# Patient Record
Sex: Female | Born: 1995 | Race: White | Hispanic: No | Marital: Single | Smoking: Current every day smoker
Health system: Southern US, Community
[De-identification: ages and names within clinical notes are randomized; demographics above are authoritative.]

## PROBLEM LIST (undated history)

## (undated) DIAGNOSIS — K759 Inflammatory liver disease, unspecified: Secondary | ICD-10-CM

## (undated) DIAGNOSIS — F419 Anxiety disorder, unspecified: Secondary | ICD-10-CM

## (undated) DIAGNOSIS — B009 Herpesviral infection, unspecified: Secondary | ICD-10-CM

## (undated) DIAGNOSIS — J45909 Unspecified asthma, uncomplicated: Secondary | ICD-10-CM

## (undated) DIAGNOSIS — R569 Unspecified convulsions: Secondary | ICD-10-CM

## (undated) DIAGNOSIS — R51 Headache: Secondary | ICD-10-CM

## (undated) DIAGNOSIS — R519 Headache, unspecified: Secondary | ICD-10-CM

## (undated) HISTORY — DX: Herpesviral infection, unspecified: B00.9

---
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
## 2017-08-03 ENCOUNTER — Other Ambulatory Visit: Payer: Self-pay

## 2017-08-03 ENCOUNTER — Encounter (HOSPITAL_COMMUNITY): Payer: Self-pay | Admitting: Behavioral Health

## 2017-08-03 ENCOUNTER — Inpatient Hospital Stay (HOSPITAL_COMMUNITY)
Admission: AD | Admit: 2017-08-03 | Discharge: 2017-08-12 | DRG: 885 | Disposition: A | Discharge status: Home / Self Care | Payer: Medicaid Other | Source: Other Acute Inpatient Hospital | Attending: Psychiatry | Admitting: Psychiatry

## 2017-08-03 DIAGNOSIS — F419 Anxiety disorder, unspecified: Secondary | ICD-10-CM | POA: Diagnosis present

## 2017-08-03 DIAGNOSIS — F15159 Other stimulant abuse with stimulant-induced psychotic disorder, unspecified: Secondary | ICD-10-CM | POA: Diagnosis present

## 2017-08-03 DIAGNOSIS — G4700 Insomnia, unspecified: Secondary | ICD-10-CM | POA: Diagnosis not present

## 2017-08-03 DIAGNOSIS — R450 Nervousness: Secondary | ICD-10-CM | POA: Diagnosis not present

## 2017-08-03 DIAGNOSIS — F39 Unspecified mood [affective] disorder: Secondary | ICD-10-CM | POA: Diagnosis not present

## 2017-08-03 DIAGNOSIS — F17210 Nicotine dependence, cigarettes, uncomplicated: Secondary | ICD-10-CM | POA: Diagnosis not present

## 2017-08-03 DIAGNOSIS — F410 Panic disorder [episodic paroxysmal anxiety] without agoraphobia: Secondary | ICD-10-CM | POA: Diagnosis present

## 2017-08-03 DIAGNOSIS — F15129 Other stimulant abuse with intoxication, unspecified: Secondary | ICD-10-CM | POA: Diagnosis present

## 2017-08-03 DIAGNOSIS — F19959 Other psychoactive substance use, unspecified with psychoactive substance-induced psychotic disorder, unspecified: Secondary | ICD-10-CM | POA: Diagnosis not present

## 2017-08-03 DIAGNOSIS — F0630 Mood disorder due to known physiological condition, unspecified: Secondary | ICD-10-CM | POA: Diagnosis not present

## 2017-08-03 DIAGNOSIS — Z560 Unemployment, unspecified: Secondary | ICD-10-CM | POA: Diagnosis not present

## 2017-08-03 DIAGNOSIS — F1210 Cannabis abuse, uncomplicated: Secondary | ICD-10-CM | POA: Diagnosis not present

## 2017-08-03 DIAGNOSIS — F333 Major depressive disorder, recurrent, severe with psychotic symptoms: Secondary | ICD-10-CM | POA: Diagnosis not present

## 2017-08-03 HISTORY — DX: Unspecified convulsions: R56.9

## 2017-08-03 HISTORY — DX: Headache, unspecified: R51.9

## 2017-08-03 HISTORY — DX: Unspecified asthma, uncomplicated: J45.909

## 2017-08-03 HISTORY — DX: Inflammatory liver disease, unspecified: K75.9

## 2017-08-03 HISTORY — DX: Anxiety disorder, unspecified: F41.9

## 2017-08-03 HISTORY — DX: Headache: R51

## 2017-08-03 MED ORDER — NICOTINE POLACRILEX 2 MG MT GUM: 2.0000 mg | CHEWING_GUM | OROMUCOSAL | Status: DC | PRN

## 2017-08-03 MED ORDER — DIPHENHYDRAMINE HCL 50 MG/ML IJ SOLN
50.0000 mg | Freq: Once | INTRAMUSCULAR | Status: AC
  Administered 2017-08-03: 50 mg via INTRAMUSCULAR
  Filled 2017-08-03: qty 1

## 2017-08-03 MED ORDER — MAGNESIUM HYDROXIDE 400 MG/5ML PO SUSP: 30.0000 mL | Freq: Every day | ORAL | Status: DC | PRN

## 2017-08-03 MED ORDER — ALUM & MAG HYDROXIDE-SIMETH 200-200-20 MG/5ML PO SUSP: 30.0000 mL | ORAL | Status: DC | PRN

## 2017-08-03 MED ORDER — HYDROXYZINE HCL 25 MG PO TABS
25.0000 mg | ORAL_TABLET | Freq: Three times a day (TID) | ORAL | Status: DC | PRN
  Administered 2017-08-05 – 2017-08-06 (×3): 25 mg via ORAL
  Filled 2017-08-03 (×3): qty 1

## 2017-08-03 MED ORDER — TRAZODONE HCL 50 MG PO TABS
50.0000 mg | ORAL_TABLET | Freq: Every evening | ORAL | Status: DC | PRN
  Administered 2017-08-03 – 2017-08-07 (×4): 50 mg via ORAL
  Filled 2017-08-03 (×14): qty 1

## 2017-08-03 MED ORDER — ACETAMINOPHEN 325 MG PO TABS: 650.0000 mg | ORAL_TABLET | Freq: Four times a day (QID) | ORAL | Status: DC | PRN

## 2017-08-03 MED ORDER — DIPHENHYDRAMINE HCL 50 MG/ML IJ SOLN
INTRAMUSCULAR | Status: AC
  Administered 2017-08-03: 50 mg via INTRAMUSCULAR
  Filled 2017-08-03: qty 1

## 2017-08-03 NOTE — BH Assessment (Addendum)
Tele Assessment Note   Patient Name: Emily Barajas MRN: [id number] Referring Physician: Jama Flavors Location of Patient: [facility]300[facility] IP ADULT Location of Provider: Behavioral Health TTS Department  Patient presents as alert and oriented. When asked why she came to the hospital, she states that a lady brought her. Patient was brought to ED by [facility] because she was acting very bizarre, yelling and very disorganized. She admits to using methamphetamine daily and states that she had been very volatile and tripping yesterday. Patient states that she has been using methamphetamine for the last two months approximately two months around one gram daily.  She states that she has been experiencing auditory and tactile hallucinations at times, but states that it is mostly when using methamphetamine.  She states that she is not suicidal or homicidal, but states that she has been treated for depression in the past and was hospitalized four years ago for a suicide attempt  She states that she is currently not on medications.  She states that she is homeless and currently on probation for possession of methamphetamines.  She states that she has never had any SA treatment in the past.  Patient initially said she had never been suicidal, but later told Emily Ruff, NP that she had attempted suicide four years ago and she states that she only heard voices when she was using, but later stated that they occurred most all the time.     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 No pertinent past medical history.  History reviewed. No pertinent surgical history.  Family History: History reviewed. No pertinent family history.  Social History:  reports that she uses drugs. Drugs: Amphetamines and Marijuana. Frequency: 7.00 times per week. She reports that she does not drink alcohol. Her tobacco history is not on file.  Additional  Social History:  Alcohol / Drug Use Pain Medications: denies Prescriptions: denies Over the Counter: denies History of alcohol / drug use?: Yes Longest period of sobriety (when/how long): none reported Negative Consequences of Use: Financial, Legal, Personal relationships, Work / School Substance #1 Name of Substance 1: methamphetamine 1 - Age of First Use: 21 1 - Amount (size/oz): <1 gram 1 - Frequency: daily 1 - Duration: 2 mos 1 - Last Use / Amount: yesterday Substance #2 Name of Substance 2: Marijuana 2 - Age of First Use: 17 2 - Amount (size/oz): unknown 2 - Frequency: daily 2 - Duration: 4 years 2 - Last Use / Amount: unknown  CIWA:   COWS:    PATIENT STRENGTHS: (choose at least two) Ability for insight Communication skills  Allergies: Not on File  Home Medications:  No medications prior to admission.    OB/GYN Status:  No LMP recorded.  General Assessment Data Location of Assessment: [facility] TTS Assessment: Out of system Is this a Tele or Face-to-Face Assessment?: Tele Assessment Is this an Initial Assessment or a Re-assessment for this encounter?: Initial Assessment Marital status: Single Maiden name: Halbleib) Is patient pregnant?: No Pregnancy Status: No Living Arrangements: Other (Comment) Can pt return to current living arrangement?: (homeless) Admission Status: Voluntary Is patient capable of signing voluntary admission?: Yes Referral Source: [facility]) Insurance type: [facility])     Crisis Care Plan Living Arrangements: Other (Comment) Legal Guardian: Other:(self) Name of Psychiatrist: (none) Name of Therapist: none  Education Status Is patient currently in school?: No Current Grade: (NA) Highest grade of school patient has completed: (not assessed) Name of  school: (NA) Contact person: NA  Risk to self with the past 6 months Suicidal Ideation: No Has patient been a risk to self within the past 6 months  prior to admission? : No Suicidal Intent: No Has patient had any suicidal intent within the past 6 months prior to admission? : No Is patient at risk for suicide?: No Suicidal Plan?: No-Not Currently/Within Last 6 Months Has patient had any suicidal plan within the past 6 months prior to admission? : No Access to Means: No What has been your use of drugs/alcohol within the last 12 months?: (2 months for the methamphetamine) Previous Attempts/Gestures: Yes How many times?: 1 Other Self Harm Risks: (homeless, unemployed, minimal support and recent loss issues) Triggers for Past Attempts: Unknown Intentional Self Injurious Behavior: None Family Suicide History: No Recent stressful life event(s): Loss (Comment)(mother) Persecutory voices/beliefs?: No Depression: Yes Depression Symptoms: Isolating, Guilt, Loss of interest in usual pleasures, Feeling worthless/self pity Suicide prevention information given to non-admitted patients: Not applicable  Risk to Others within the past 6 months Homicidal Ideation: No-Not Currently/Within Last 6 Months Does patient have any lifetime risk of violence toward others beyond the six months prior to admission? : No Thoughts of Harm to Others: No Current Homicidal Intent: No Current Homicidal Plan: No Access to Homicidal Means: No Identified Victim: (none) History of harm to others?: No Assessment of Violence: None Noted Violent Behavior Description: (none) Does patient have access to weapons?: No Criminal Charges Pending?: No Does patient have a court date: No Is patient on probation?: Yes  Psychosis Hallucinations: Auditory, Tactile, Visual Delusions: None noted  Mental Status Report Appearance/Hygiene: Disheveled Eye Contact: Fair Motor Activity: Psychomotor retardation Speech: Soft Level of Consciousness: Quiet/awake Mood: Depressed, Apathetic, Despair, Sad Affect: Depressed, Flat Anxiety Level: Severe Thought Processes: Coherent,  Relevant Judgement: Impaired Orientation: Person, Place, Time Obsessive Compulsive Thoughts/Behaviors: Moderate  Cognitive Functioning Concentration: Decreased Memory: Remote Intact, Recent Impaired IQ: Below Average Insight: Poor Impulse Control: Poor Appetite: Poor Sleep: Decreased Total Hours of Sleep: 3  ADLScreening [facility] Services) Patient's cognitive ability adequate to safely complete daily activities?: Yes Patient able to express need for assistance with ADLs?: Yes Independently performs ADLs?: Yes (appropriate for developmental age)  Prior Inpatient Therapy Prior Inpatient Therapy: Yes Prior Therapy Dates: (4 years ago) Prior Therapy Facilty/Provider(s): (denies)  Prior Outpatient Therapy Prior Outpatient Therapy: No Prior Therapy Dates: (no) Prior Therapy Facilty/Provider(s): (none) Reason for Treatment: (NA) Does patient have an ACCT team?: No Does patient have Intensive In-House Services?  : No Does patient have [facility] services? : No Does patient have P4CC services?: No  ADL Screening (condition at time of admission) Patient's cognitive ability adequate to safely complete daily activities?: Yes Is the patient deaf or have difficulty hearing?: No Does the patient have difficulty seeing, even when wearing glasses/contacts?: No Does the patient have difficulty concentrating, remembering, or making decisions?: No Patient able to express need for assistance with ADLs?: Yes Does the patient have difficulty dressing or bathing?: No Independently performs ADLs?: Yes (appropriate for developmental age) Communication: Independent Dressing (OT): Independent Grooming: Independent Feeding: Independent Bathing: Independent Toileting: Independent In/Out Bed: Independent Walks in Home: Independent Does the patient have difficulty walking or climbing stairs?: No Weakness of Legs: None Weakness of Arms/Hands: None             Advance Directives (For  Healthcare) Does Patient Have a Medical Advance Directive?: No Would patient like information on creating a medical advance directive?: No - Patient declined  Additional Information 1:1 In Past 12 Months?: No CIRT Risk: No Elopement Risk: No Does patient have medical clearance?: No     Disposition:  Disposition Initial Assessment Completed for this Encounter: Yes Disposition of Patient: Inpatient treatment program Type of inpatient treatment program: Adult  This service was provided via telemedicine using a 2-way, interactive audio and video technology.  Names of all persons participating in this telemedicine service and their role in this encounter. Name: [place] Laker Role: patient  Name: Josephina Gipanny Zehava Turski, [place], LCAS Role: TTS Counselor  Name:  Role:   Name:  Role:     Daphene CalamityDanny J Brittani Purdum 08/03/2017 6:27 PM

## 2017-08-03 NOTE — Tx Team (Signed)
Initial Treatment Plan 08/03/2017 11:01 PM [name] [id number]    PATIENT STRESSORS: Financial difficulties Health problems Loss of Mother Substance abuse   PATIENT STRENGTHS: Active sense of humor Average or above average intelligence Motivation for treatment/growth   PATIENT IDENTIFIED PROBLEMS: Depression  Suicidal Ideation  Substance abuse (methamphetamine)    "Help me to better understand addiction"             DISCHARGE CRITERIA:  Adequate post-discharge living arrangements Improved stabilization in mood, thinking, and/or behavior Motivation to continue treatment in a less acute level of care Need for constant or close observation no longer present Verbal commitment to aftercare and medication compliance Withdrawal symptoms are absent or subacute and managed without 24-hour nursing intervention  PRELIMINARY DISCHARGE PLAN: Attend 12-step recovery group Outpatient therapy Placement in alternative living arrangements  PATIENT/FAMILY INVOLVEMENT: This treatment plan has been presented to and reviewed with the patient, Emily Barajas.  The patient and family have been given the opportunity to ask questions and make suggestions.  Juliann ParesBowman, Marisa Hufstetler Elizabeth, RN 08/03/2017, 11:01 PM
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
## 2017-08-04 DIAGNOSIS — F333 Major depressive disorder, recurrent, severe with psychotic symptoms: Principal | ICD-10-CM

## 2017-08-04 DIAGNOSIS — F19959 Other psychoactive substance use, unspecified with psychoactive substance-induced psychotic disorder, unspecified: Secondary | ICD-10-CM

## 2017-08-04 DIAGNOSIS — F17210 Nicotine dependence, cigarettes, uncomplicated: Secondary | ICD-10-CM

## 2017-08-04 LAB — COMPREHENSIVE METABOLIC PANEL
ALT: 18 U/L (ref 14–54)
AST: 21 U/L (ref 15–41)
Albumin: 3.2 g/dL — ABNORMAL LOW (ref 3.5–5.0)
Alkaline Phosphatase: 56 U/L (ref 38–126)
Anion gap: 5 (ref 5–15)
BUN: 16 mg/dL (ref 6–20)
CO2: 23 mmol/L (ref 22–32)
Calcium: 8.5 mg/dL — ABNORMAL LOW (ref 8.9–10.3)
Chloride: 110 mmol/L (ref 101–111)
Creatinine, Ser: 0.9 mg/dL (ref 0.44–1.00)
GFR calc Af Amer: >60 mL/min (ref >60)
GFR calc non Af Amer: >60 mL/min (ref >60)
Glucose, Bld: 88 mg/dL (ref 65–99)
Potassium: 4.2 mmol/L (ref 3.5–5.1)
Sodium: 138 mmol/L (ref 135–145)
Total Bilirubin: 0.8 mg/dL (ref 0.3–1.2)
Total Protein: 6.1 g/dL — ABNORMAL LOW (ref 6.5–8.1)

## 2017-08-04 LAB — PREGNANCY, URINE: Preg Test, Ur: NEGATIVE

## 2017-08-04 LAB — RAPID URINE DRUG SCREEN, HOSP PERFORMED
Amphetamines: POSITIVE — AB
Barbiturates: NOT DETECTED
Benzodiazepines: NOT DETECTED
Cocaine: NOT DETECTED
Opiates: NOT DETECTED
Tetrahydrocannabinol: POSITIVE — AB

## 2017-08-04 MED ORDER — RISPERIDONE 0.5 MG PO TABS
0.5000 mg | ORAL_TABLET | Freq: Two times a day (BID) | ORAL | Status: DC
  Administered 2017-08-04 – 2017-08-05 (×3): 0.5 mg via ORAL
  Filled 2017-08-04 (×6): qty 1

## 2017-08-04 NOTE — Plan of Care (Deleted)
Patient provided uninterrupted sleep
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
## 2017-08-04 NOTE — [facility] Group Notes (Signed)
[facility] Group Notes:  (Nursing)  Date:  08/04/2017  Time:  9:00 am Type of Therapy:  Nurse Education  Participation Level:  Did Not Attend  Participation Quality:  did not attend  Affect:  did not attend  Cognitive:  did not attend  Insight:  None  Engagement in Group:  did not attend  Modes of Intervention:  did not attend  Summary of Progress/Problems: Patient remained in bed during group  Shela NevinValerie S Toryn Dewalt 08/04/2017, 1:48 PM

## 2017-08-04 NOTE — Progress Notes (Addendum)
D. Pt in bed sleeping upon initial approach -resp even and unlabored. Pt easily aroused from sleep and was brought a pitcher of water and encouraged to drink fluids.  Pt denies pain and reports that she just wants to sleep because she 'hasn't slept in awhile'. With encouragement, pt walked to cafeteria with peers for lunch. Pt currently denies SI/HI . When pt was asked about A/V hallucinations, pt responded, "sometimes my ear is drained". When pt asked to elaborate she states, "you know how your vocabulary is empty?"  Pt completed self inventory, rating her depression a 5/10 and hopelessness a 6/10. On self inventory, pt writes as her goal today, "to live", and  "sleep then understand", when asked how she will meet her goal. When asked if there is anything else she would like to tell staff or questions she may have, she responded by writing, "foods, colors,". A. Labs and vitals monitored. . Pt supported emotionally and encouraged to express concerns and ask questions.   R. Pt remains safe with 15 minute checks. Will continue POC.

## 2017-08-04 NOTE — [facility] Group Notes (Signed)
The focus of this group is to educate the patient on the purpose and policies of crisis stabilization and provide a format to answer questions about their admission.  The group details unit policies and expectations of patients while admitted.  Patient did not attend 0900 nurse education orientation group this morning.  Patient stayed in bed.   

## 2017-08-04 NOTE — H&P (Signed)
Psychiatric Admission Assessment Adult  Patient Identification: Emily Barajas MRN:  [id number] Date of Evaluation:  08/04/2017 Chief Complaint:  Bizarre behavior and hallucinations Principal Diagnosis: Substance Induced Psychotic Disorder Diagnosis:   Patient Active Problem List   Diagnosis Date Noted  . Severe recurrent major depression with psychotic features [facility](HCC) [F33.3] 08/03/2017   History of Present Illness:  21 y.o Caucasian female, single, no kids, unemployed, lives with her family. Background history of SUD. Presented to the unit via MCT. Reported to have been acting in a bizarre manner. Reported having tactile and auditory hallucination. Intoxicated with methamphetamine.    At interview, patient reports history of methamphetamine use for the past three years. Says she was just having a bad trip. She was hearing a lot of noises. She could not make out what is being said. She was not getting any specific instructions. She was having a lot of irritability as it felt as if ants were crawling all over her body. Says she was seeing "the city". Patient denies any suicidal thoughts. She did not have any thoughts to harm other. Says she was given some shot at the hospital. Says she feels marginally better. The hallucinations are less intense. No persecutory delusion. Not expressing any other form of delusion. Patient denies use of alcohol or any other substance. She denies any access to weapons. Denies any stressors.   Total Time spent with patient: 1 hour  Past Psychiatric History: Long history of SUD. Off and on transient psychotic episodes from the substances. Says she had been treated with Risperidone in the past. No past follow up. No past inpatient care. No past chemical dependency treatment. Overdosed four years ago. Denies any other suicidal behavior. No past history of violent behavior.   Is the patient at risk to self? No.  Has the patient been a risk to self in the past 6  months? No.  Has the patient been a risk to self within the distant past? Yes.    Is the patient a risk to others? No.  Has the patient been a risk to others in the past 6 months? No.  Has the patient been a risk to others within the distant past? No.   Prior Inpatient Therapy: Prior Inpatient Therapy: Yes Prior Therapy Dates: (4 years ago) Prior Therapy Facilty/Provider(s): (denies) Prior Outpatient Therapy: Prior Outpatient Therapy: No Prior Therapy Dates: (no) Prior Therapy Facilty/Provider(s): (none) Reason for Treatment: (NA) Does patient have an ACCT team?: No Does patient have Intensive In-House Services?  : No Does patient have [facility] services? : No Does patient have P4CC services?: No  Alcohol Screening: 1. How often do you have a drink containing alcohol?: Never 2. How many drinks containing alcohol do you have on a typical day when you are drinking?: 1 or 2 3. How often do you have six or more drinks on one occasion?: Never AUDIT-C Score: 0 Intervention/Follow-up: AUDIT Score <7 follow-up not indicated Substance Abuse History in the last 12 months:  Yes.   Consequences of Substance Abuse: As above  Previous Psychotropic Medications: Yes  Psychological Evaluations: No  Past Medical History:  Past Medical History:  Diagnosis Date  . Anxiety   . Asthma   . Headache   . Hepatitis   . Seizures (HCC)    Pt does not remember when last one was, states "it's been awhile"   History reviewed. No pertinent surgical history. Family History: History reviewed. No pertinent family history. Family Psychiatric  History: Denies any family history  of mental illness or suicide. Strong family history of addiction.  Tobacco Screening: Have you used any form of tobacco in the last 30 days? (Cigarettes, Smokeless Tobacco, Cigars, and/or Pipes): Yes Tobacco use, Select all that apply: 5 or more cigarettes per day Are you interested in Tobacco Cessation Medications?: Yes, will notify MD  for an order Counseled patient on smoking cessation including recognizing danger situations, developing coping skills and basic information about quitting provided: Refused/Declined practical counseling Social History:  Social History   Substance and Sexual Activity  Alcohol Use No  . Frequency: Never     Social History   Substance and Sexual Activity  Drug Use Yes  . Frequency: 7.0 times per week  . Types: Amphetamines, Marijuana    Additional Social History: Marital status: Single    Pain Medications: denies Prescriptions: denies Over the Counter: denies History of alcohol / drug use?: Yes Longest period of sobriety (when/how long): none reported Negative Consequences of Use: Financial, Armed forces operational officerLegal, Personal relationships, Work / School Name of Substance 1: methamphetamine 1 - Age of First Use: 21 1 - Amount (size/oz): <1 gram 1 - Frequency: daily 1 - Duration: 2 mos 1 - Last Use / Amount: yesterday Name of Substance 2: Marijuana 2 - Age of First Use: 17 2 - Amount (size/oz): unknown 2 - Frequency: daily 2 - Duration: 4 years 2 - Last Use / Amount: unknown                Allergies:   Allergies  Allergen Reactions  . Amoxicillin Anaphylaxis   Lab Results: No results found for this or any previous visit (from the past 48 hour(s)).  Blood Alcohol level:  No results found for: [facility]  Metabolic Disorder Labs:  No results found for: HGBA1C, MPG No results found for: PROLACTIN No results found for: CHOL, TRIG, HDL, CHOLHDL, VLDL, LDLCALC  Current Medications: Current Facility-Administered Medications  Medication Dose Route Frequency Provider Last Rate Last Dose  . acetaminophen (TYLENOL) tablet 650 mg  650 mg Oral Q6H PRN Jackelyn PolingBerry, Jason A, NP      . alum & mag hydroxide-simeth (MAALOX/MYLANTA) 200-200-20 MG/5ML suspension 30 mL  30 mL Oral Q4H PRN Nira ConnBerry, Jason A, NP      . hydrOXYzine (ATARAX/VISTARIL) tablet 25 mg  25 mg Oral TID PRN Nira ConnBerry, Jason A, NP      .  magnesium hydroxide (MILK OF MAGNESIA) suspension 30 mL  30 mL Oral Daily PRN Nira ConnBerry, Jason A, NP      . nicotine polacrilex (NICORETTE) gum 2 mg  2 mg Oral PRN Cobos, Rockey SituFernando A, MD      . traZODone (DESYREL) tablet 50 mg  50 mg Oral QHS,MR X 1 Nira ConnBerry, Jason A, NP   50 mg at 08/03/17 2221   PTA Medications: No medications prior to admission.    Musculoskeletal: Strength & Muscle Tone: within normal limits Gait & Station: normal Patient leans: N/A  Psychiatric Specialty Exam: Physical Exam  Constitutional: She appears distressed.  HENT:  Head: Normocephalic and atraumatic.  Respiratory: Effort normal.  Psychiatric:  As above    ROS  Blood pressure 110/70, pulse 92, temperature 98.4 F (36.9 C), resp. rate 16, height 5\' 1"  (1.549 m), weight 54 kg (119 lb).Body mass index is 22.48 kg/m.  General Appearance: In hospital clothing, withdrawn, underlying irritability.   Eye Contact:  Minimal  Speech:  Garbled  Volume:  Decreased  Mood:  Dysphoric and Irritable  Affect:  Blunted and mood congruent  Thought Process:  Linear  Orientation:  Full (Time, Place, and Person)  Thought Content:  Auditory and tactile hallucinations. No thoughts of violence.   Suicidal Thoughts:  No  Homicidal Thoughts:  No  Memory:  Unable to assess at this time.   Judgement:  Poor  Insight:  Shallow  Psychomotor Activity:  Decreased  Concentration:  Poor   Recall:  Unable to assess at this time.   Fund of Knowledge:  Unable to assess at this time.   Language:  Fair  Akathisia:  Negative  Handed:    AIMS (if indicated):     Assets:  Physical Health Resilience  ADL's:  Impaired  Cognition:  Impaired,  Mild  Sleep:  Number of Hours: 6.75    Treatment Plan Summary: Patient is intoxicated with methamphetamine. She is still internally distracted. I discussed use of Risperidone to target psychosis. She consented to treatment after we reviewed the risks and benefits.   Psychiatric: Substance Induced  Psychotic disorder  Medical:  Psychosocial:   PLAN: 1. Risperidone 0.5 mg BID 2. Encourage unit groups and activities 3. Monitor mood, behavior and interaction with peers 4. Motivational enhancement  5. SW would gather collateral and facilitate aftercare.    Observation Level/Precautions:  15 minute checks  Laboratory:  CBC Chemistry Profile HCG UDS  Psychotherapy:    Medications:    Consultations:    Discharge Concerns:    Estimated LOS:  Other:     Physician Treatment Plan for Primary Diagnosis: <principal problem not specified> Long Term Goal(s): Improvement in symptoms so as ready for discharge  Short Term Goals: Ability to identify changes in lifestyle to reduce recurrence of condition will improve, Ability to verbalize feelings will improve, Ability to disclose and discuss suicidal ideas, Ability to demonstrate self-control will improve, Ability to identify and develop effective coping behaviors will improve, Ability to maintain clinical measurements within normal limits will improve, Compliance with prescribed medications will improve and Ability to identify triggers associated with substance abuse/mental health issues will improve  Physician Treatment Plan for Secondary Diagnosis: Active Problems:   Severe recurrent major depression with psychotic features (HCC)  Long Term Goal(s): Improvement in symptoms so as ready for discharge  Short Term Goals: Ability to identify changes in lifestyle to reduce recurrence of condition will improve, Ability to verbalize feelings will improve, Ability to disclose and discuss suicidal ideas, Ability to demonstrate self-control will improve, Ability to identify and develop effective coping behaviors will improve, Ability to maintain clinical measurements within normal limits will improve, Compliance with prescribed medications will improve and Ability to identify triggers associated with substance abuse/mental health issues will  improve  I certify that inpatient services furnished can reasonably be expected to improve the patient's condition.    Georgiann Cocker, MD 12/23/201812:00 PM

## 2017-08-04 NOTE — [facility] Group Notes (Signed)
[facility] LCSW Group Therapy Note  Date/Time:  08/04/2017 1:30-2:30pm  Type of Therapy and Topic:  Group Therapy:  Healthy and Unhealthy Supports  Participation Level:  Did Not Attend   Description of Group:  Patients in this group were introduced to the idea of adding a variety of healthy supports to address the various needs in their lives.  Because of one patient's resistance and insistence that drugs help rather than harm, the Stages of Change were introduced and ideas proposed about appropriate supports for each stage.  Therapeutic Goals:   1)  discuss importance of adding supports to stay well once out of the hospital  2)  compare healthy versus unhealthy supports and identify some examples of each  3)  Describe Stages of Change  3)  generate ideas and descriptions of healthy supports that can be added for each Stage  5)  encourage active participation in and adherence to discharge plan    Summary of Patient Progress:  N/A   Therapeutic Modalities:   Motivational Interviewing Brief Solution-Focused Therapy  Ambrose MantleMareida Grossman-Orr, LCSW
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
## 2017-08-05 DIAGNOSIS — F0630 Mood disorder due to known physiological condition, unspecified: Secondary | ICD-10-CM

## 2017-08-05 DIAGNOSIS — F419 Anxiety disorder, unspecified: Secondary | ICD-10-CM

## 2017-08-05 DIAGNOSIS — R450 Nervousness: Secondary | ICD-10-CM

## 2017-08-05 LAB — CBC WITH DIFFERENTIAL/PLATELET
Basophils Absolute: 0 10*3/uL (ref 0.0–0.1)
Basophils Relative: 0 %
Eosinophils Absolute: 0.1 10*3/uL (ref 0.0–0.7)
Eosinophils Relative: 2 %
HCT: 41.4 % (ref 36.0–46.0)
Hemoglobin: 13.8 g/dL (ref 12.0–15.0)
Lymphocytes Relative: 43 %
Lymphs Abs: 3.2 10*3/uL (ref 0.7–4.0)
MCH: 29.1 pg (ref 26.0–34.0)
MCHC: 33.3 g/dL (ref 30.0–36.0)
MCV: 87.2 fL (ref 78.0–100.0)
Monocytes Absolute: 0.5 10*3/uL (ref 0.1–1.0)
Monocytes Relative: 6 %
Neutro Abs: 3.7 10*3/uL (ref 1.7–7.7)
Neutrophils Relative %: 49 %
Platelets: 287 10*3/uL (ref 150–400)
RBC: 4.75 MIL/uL (ref 3.87–5.11)
RDW: 12.8 % (ref 11.5–15.5)
WBC: 7.5 10*3/uL (ref 4.0–10.5)

## 2017-08-05 LAB — LIPID PANEL
Cholesterol: 153 mg/dL (ref 0–200)
HDL: 49 mg/dL (ref >40)
LDL Cholesterol: 93 mg/dL (ref 0–99)
Total CHOL/HDL Ratio: 3.1 RATIO
Triglycerides: 53 mg/dL (ref <150)
VLDL: 11 mg/dL (ref 0–40)

## 2017-08-05 MED ORDER — CITALOPRAM HYDROBROMIDE 10 MG PO TABS
10.0000 mg | ORAL_TABLET | Freq: Every day | ORAL | Status: DC
  Administered 2017-08-05 – 2017-08-10 (×6): 10 mg via ORAL
  Filled 2017-08-05 (×8): qty 1

## 2017-08-05 NOTE — Progress Notes (Signed)
[facility] MD Progress Note  08/05/2017 11:19 AM Emily Barajas  MRN:  [id number] Subjective:  Alert, oriented, subuded, depressed, didn't sleep well HPI: As per initial admission note :21 y.o Caucasian female, single, no kids, unemployed, lives with her family. Background history of SUD. Presented to the unit via MCT. Reported to have been acting in a bizarre manner. Reported having tactile and auditory hallucination. Intoxicated with methamphetamine.    At interview, patient reports history of methamphetamine use for the past three years. Says she was just having a bad trip. She was hearing a lot of noises. She could not make out what is being said. She was not getting any specific instructions. She was having a lot of irritability"  On evaluation today somewhat calmer, remains subdued. Less irritable. Denies voices. Feels she is fatigue after being off of meth now Feeling anxious, dysphoric   Principal Problem: Substance-induced psychotic disorder ([place]) Diagnosis:   Patient Active Problem List   Diagnosis Date Noted  . Substance-induced psychotic disorder [facility]) [F19.959] 08/03/2017   Total Time spent with patient: 20 minutes  Past Psychiatric History: substance use  Past Medical History:  Past Medical History:  Diagnosis Date  . Anxiety   . Asthma   . Headache   . Hepatitis   . Seizures ([place])    Pt does not remember when last one was, states "it's been awhile"   History reviewed. No pertinent surgical history. Family History: History reviewed. No pertinent family history. Family Psychiatric  History: see chart Social History:  Social History   Substance and Sexual Activity  Alcohol Use No  . Frequency: Never     Social History   Substance and Sexual Activity  Drug Use Yes  . Frequency: 7.0 times per week  . Types: Amphetamines, Marijuana    Social History   Socioeconomic History  . Marital status: Single    Spouse name: None  . Number of children: None  . Years  of education: None  . Highest education level: None  Social Needs  . Financial resource strain: None  . Food insecurity - worry: None  . Food insecurity - inability: None  . Transportation needs - medical: None  . Transportation needs - non-medical: None  Occupational History  . None  Tobacco Use  . Smoking status: Current Every Day Smoker    Packs/day: 1.00    Types: Cigarettes  . Smokeless tobacco: Never Used  Substance and Sexual Activity  . Alcohol use: No    Frequency: Never  . Drug use: Yes    Frequency: 7.0 times per week    Types: Amphetamines, Marijuana  . Sexual activity: Not Currently  Other Topics Concern  . None  Social History Narrative  . None   Additional Social History:    Pain Medications: denies Prescriptions: denies Over the Counter: denies History of alcohol / drug use?: Yes Longest period of sobriety (when/how long): none reported Negative Consequences of Use: Financial, Legal, Personal relationships, Work / School Name of Substance 1: methamphetamine 1 - Age of First Use: 21 1 - Amount (size/oz): <1 gram 1 - Frequency: daily 1 - Duration: 2 mos 1 - Last Use / Amount: yesterday Name of Substance 2: Marijuana 2 - Age of First Use: 17 2 - Amount (size/oz): unknown 2 - Frequency: daily 2 - Duration: 4 years 2 - Last Use / Amount: unknown                Sleep: Fair  Appetite:  Fair  Current Medications: Current Facility-Administered Medications  Medication Dose Route Frequency Provider Last Rate Last Dose  . acetaminophen (TYLENOL) tablet 650 mg  650 mg Oral Q6H PRN Rozetta Nunnery, NP      . alum & mag hydroxide-simeth (MAALOX/MYLANTA) 200-200-20 MG/5ML suspension 30 mL  30 mL Oral Q4H PRN Rozetta Nunnery, NP      . citalopram (CELEXA) tablet 10 mg  10 mg Oral Daily Merian Capron, MD      . hydrOXYzine (ATARAX/VISTARIL) tablet 25 mg  25 mg Oral TID PRN Lindon Romp A, NP      . magnesium hydroxide (MILK OF MAGNESIA) suspension 30 mL   30 mL Oral Daily PRN Lindon Romp A, NP      . nicotine polacrilex (NICORETTE) gum 2 mg  2 mg Oral PRN Cobos, Myer Peer, MD      . risperiDONE (RISPERDAL) tablet 0.5 mg  0.5 mg Oral BID Izediuno, Laruth Bouchard, MD   0.5 mg at 08/05/17 0754  . traZODone (DESYREL) tablet 50 mg  50 mg Oral QHS,MR X 1 Lindon Romp A, NP   50 mg at 08/03/17 2221    Lab Results:  Results for orders placed or performed during the hospital encounter of 08/03/17 (from the past 48 hour(s))  Comprehensive metabolic panel     Status: Abnormal   Collection Time: 08/04/17  6:45 PM  Result Value Ref Range   Sodium 138 135 - 145 mmol/L   Potassium 4.2 3.5 - 5.1 mmol/L   Chloride 110 101 - 111 mmol/L   CO2 23 22 - 32 mmol/L   Glucose, Bld 88 65 - 99 mg/dL   BUN 16 6 - 20 mg/dL   Creatinine, Ser 0.90 0.44 - 1.00 mg/dL   Calcium 8.5 (L) 8.9 - 10.3 mg/dL   Total Protein 6.1 (L) 6.5 - 8.1 g/dL   Albumin 3.2 (L) 3.5 - 5.0 g/dL   AST 21 15 - 41 U/L   ALT 18 14 - 54 U/L   Alkaline Phosphatase 56 38 - 126 U/L   Total Bilirubin 0.8 0.3 - 1.2 mg/dL   GFR calc non Af Amer >60 >60 mL/min   GFR calc Af Amer >60 >60 mL/min    Comment: (NOTE) The eGFR has been calculated using the CKD EPI equation. This calculation has not been validated in all clinical situations. eGFR's persistently <60 mL/min signify possible Chronic Kidney Disease.    Anion gap 5 5 - 15    Comment: Performed at [facility], [place] [address] [place], [place] [address]  Rapid urine drug screen (hospital performed)     Status: Abnormal   Collection Time: 08/04/17  8:44 PM  Result Value Ref Range   Opiates NONE DETECTED NONE DETECTED   Cocaine NONE DETECTED NONE DETECTED   Benzodiazepines NONE DETECTED NONE DETECTED   Amphetamines POSITIVE (A) NONE DETECTED   Tetrahydrocannabinol POSITIVE (A) NONE DETECTED   Barbiturates NONE DETECTED NONE DETECTED    Comment: (NOTE) DRUG SCREEN FOR MEDICAL PURPOSES ONLY.  IF CONFIRMATION IS NEEDED FOR  ANY PURPOSE, NOTIFY LAB WITHIN 5 DAYS. LOWEST DETECTABLE LIMITS FOR URINE DRUG SCREEN Drug Class                     Cutoff (ng/mL) Amphetamine and metabolites    1000 Barbiturate and metabolites    200 Benzodiazepine                 914 Tricyclics and metabolites  300 Opiates and metabolites        300 Cocaine and metabolites        300 THC                            50 Performed at [facility] [address], [place], [place] [address]   Pregnancy, urine     Status: None   Collection Time: 08/04/17  8:44 PM  Result Value Ref Range   Preg Test, Ur NEGATIVE NEGATIVE    Comment:        THE SENSITIVITY OF THIS METHODOLOGY IS >20 mIU/mL. Performed at [facility], [place] [address], [place], [place] [address]   Lipid panel     Status: None   Collection Time: 08/05/17  6:10 AM  Result Value Ref Range   Cholesterol 153 0 - 200 mg/dL   Triglycerides 53 <150 mg/dL   HDL 49 >40 mg/dL   Total CHOL/HDL Ratio 3.1 RATIO   VLDL 11 0 - 40 mg/dL   LDL Cholesterol 93 0 - 99 mg/dL    Comment:        Total Cholesterol/HDL:CHD Risk Coronary Heart Disease Risk Table                     Men   Women  1/2 Average Risk   3.4   3.3  Average Risk       5.0   4.4  2 X Average Risk   9.6   7.1  3 X Average Risk  23.4   11.0        Use the calculated Patient Ratio above and the CHD Risk Table to determine the patient's CHD Risk.        ATP III CLASSIFICATION (LDL):  <100     mg/dL   Optimal  100-129  mg/dL   Near or Above                    Optimal  130-159  mg/dL   Borderline  160-189  mg/dL   High  >190     mg/dL   Very High Performed at [facility] [address], [place], [place] [address]   CBC with Differential/Platelet     Status: None   Collection Time: 08/05/17  6:10 AM  Result Value Ref Range   WBC 7.5 4.0 - 10.5 K/uL   RBC 4.75 3.87 - 5.11 MIL/uL   Hemoglobin 13.8 12.0 - 15.0 g/dL   HCT 41.4 36.0 - 46.0 %   MCV  87.2 78.0 - 100.0 fL   MCH 29.1 26.0 - 34.0 pg   MCHC 33.3 30.0 - 36.0 g/dL   RDW 12.8 11.5 - 15.5 %   Platelets 287 150 - 400 K/uL   Neutrophils Relative % 49 %   Neutro Abs 3.7 1.7 - 7.7 K/uL   Lymphocytes Relative 43 %   Lymphs Abs 3.2 0.7 - 4.0 K/uL   Monocytes Relative 6 %   Monocytes Absolute 0.5 0.1 - 1.0 K/uL   Eosinophils Relative 2 %   Eosinophils Absolute 0.1 0.0 - 0.7 K/uL   Basophils Relative 0 %   Basophils Absolute 0.0 0.0 - 0.1 K/uL    Comment: Performed at [facility], [place] [address], [place], [place] [address]    Blood Alcohol level:  No results found for: [facility]  Metabolic Disorder Labs: No  results found for: HGBA1C, MPG No results found for: PROLACTIN Lab Results  Component Value Date   CHOL 153 08/05/2017   TRIG 53 08/05/2017   HDL 49 08/05/2017   CHOLHDL 3.1 08/05/2017   VLDL 11 08/05/2017   LDLCALC 93 08/05/2017    Physical Findings: AIMS: Facial and Oral Movements Muscles of Facial Expression: Mild Lips and Perioral Area: Mild Jaw: Mild Tongue: Mild,Extremity Movements Upper (arms, wrists, hands, fingers): None, normal Lower (legs, knees, ankles, toes): None, normal, Trunk Movements Neck, shoulders, hips: None, normal, Overall Severity Severity of abnormal movements (highest score from questions above): Mild Incapacitation due to abnormal movements: None, normal Patient's awareness of abnormal movements (rate only patient's report): Aware, mild distress, Dental Status Current problems with teeth and/or dentures?: No Does patient usually wear dentures?: No  CIWA:  CIWA-Ar Total: 4 COWS:     Musculoskeletal: Strength & Muscle Tone: within normal limits Gait & Station: normal Patient leans: no lean  Psychiatric Specialty Exam: Physical Exam  Constitutional: She appears well-developed.    Review of Systems  Cardiovascular: Negative for chest pain.  Skin: Negative for rash.  Psychiatric/Behavioral: Positive for  depression and substance abuse. The patient is nervous/anxious.     Blood pressure 113/87, pulse 95, temperature (!) 97.4 F (36.3 C), temperature source Oral, resp. rate 16, height 5' 1" (1.549 m), weight 54 kg (119 lb).Body mass index is 22.48 kg/m.  General Appearance: Casual  Eye Contact:  Fair  Speech:  Normal Rate  Volume:  Decreased  Mood:  Dysphoric  Affect:  Congruent  Thought Process:  Goal Directed  Orientation:  Full (Time, Place, and Person)  Thought Content:  Paranoid Ideation  Suicidal Thoughts:  No  Homicidal Thoughts:  No  Memory:  Immediate;   Fair Recent;   Fair  Judgement:  Poor  Insight:  Shallow  Psychomotor Activity:  Normal  Concentration:  Concentration: Fair and Attention Span: Fair  Recall:  [employer] of Knowledge:  Fair  Language:  Fair  Akathisia:  Negative  Handed:  Right  AIMS (if indicated):     Assets:  Desire for Improvement Social Support  ADL's:  Intact  Cognition:  WNL  Sleep:  Number of Hours: 6.75     Treatment Plan Summary: Daily contact with patient to assess and evaluate symptoms and progress in treatment, Medication management and Plan as follows  1. Substance induced psychotic disorder: improved. On risperdal. Will continue 2. Mood disorder ; dysphoric. Will start celexa 1m qd Encourage support groups and unit activities.   NMerian Capron MD 08/05/2017, 11:19 AM

## 2017-08-05 NOTE — Progress Notes (Signed)
DAR NOTE: Patient presents with anxious affect and depressed mood.  Denies pain, auditory and visual hallucinations.  Described energy level as low and concentration as good.  Rates depression at 5, hopelessness at 6, and anxiety at 6.  Maintained on routine safety checks.  Medications given as prescribed.  Support and encouragement offered as needed.  Attended group and participated.  States goal for today is "my withdrawals."  Patient observed socializing with peers in the dayroom.  Lab unable to complete blood draw due to scar on arms.  Patient is safe on the unit.

## 2017-08-05 NOTE — Tx Team (Signed)
Interdisciplinary Treatment and Diagnostic Plan Update  08/05/2017 Time of Session: 8:01 AM  Emily Barajas MRN: [id number]  Principal Diagnosis: Substance-induced psychotic disorder [facility])  Secondary Diagnoses: Principal Problem:   Substance-induced psychotic disorder ([place])   Current Medications:  Current Facility-Administered Medications  Medication Dose Route Frequency Provider Last Rate Last Dose  . acetaminophen (TYLENOL) tablet 650 mg  650 mg Oral Q6H PRN Rozetta Nunnery, NP      . alum & mag hydroxide-simeth (MAALOX/MYLANTA) 200-200-20 MG/5ML suspension 30 mL  30 mL Oral Q4H PRN Lindon Romp A, NP      . hydrOXYzine (ATARAX/VISTARIL) tablet 25 mg  25 mg Oral TID PRN Lindon Romp A, NP      . magnesium hydroxide (MILK OF MAGNESIA) suspension 30 mL  30 mL Oral Daily PRN Lindon Romp A, NP      . nicotine polacrilex (NICORETTE) gum 2 mg  2 mg Oral PRN Cobos, Myer Peer, MD      . risperiDONE (RISPERDAL) tablet 0.5 mg  0.5 mg Oral BID Izediuno, Laruth Bouchard, MD   0.5 mg at 08/05/17 0754  . traZODone (DESYREL) tablet 50 mg  50 mg Oral QHS,MR X 1 Lindon Romp A, NP   50 mg at 08/03/17 2221    PTA Medications: No medications prior to admission.    Patient Stressors: Financial difficulties Health problems Loss of Mother Substance abuse  Patient Strengths: Ability for insight Communication skills  Treatment Modalities: Medication Management, Group therapy, Case management,  1 to 1 session with clinician, Psychoeducation, Recreational therapy.   Physician Treatment Plan for Primary Diagnosis: Substance-induced psychotic disorder ([place]) Long Term Goal(s): Improvement in symptoms so as ready for discharge  Short Term Goals: Ability to identify changes in lifestyle to reduce recurrence of condition will improve Ability to verbalize feelings will improve Ability to disclose and discuss suicidal ideas Ability to demonstrate self-control will improve Ability to identify and develop  effective coping behaviors will improve Ability to maintain clinical measurements within normal limits will improve Compliance with prescribed medications will improve Ability to identify triggers associated with substance abuse/mental health issues will improve Ability to identify changes in lifestyle to reduce recurrence of condition will improve Ability to verbalize feelings will improve Ability to disclose and discuss suicidal ideas Ability to demonstrate self-control will improve Ability to identify and develop effective coping behaviors will improve Ability to maintain clinical measurements within normal limits will improve Compliance with prescribed medications will improve Ability to identify triggers associated with substance abuse/mental health issues will improve  Medication Management: Evaluate patient's response, side effects, and tolerance of medication regimen.  Therapeutic Interventions: 1 to 1 sessions, Unit Group sessions and Medication administration.  Evaluation of Outcomes: Progressing  Physician Treatment Plan for Secondary Diagnosis: Principal Problem:   Substance-induced psychotic disorder ([place])   Long Term Goal(s): Improvement in symptoms so as ready for discharge  Short Term Goals: Ability to identify changes in lifestyle to reduce recurrence of condition will improve Ability to verbalize feelings will improve Ability to disclose and discuss suicidal ideas Ability to demonstrate self-control will improve Ability to identify and develop effective coping behaviors will improve Ability to maintain clinical measurements within normal limits will improve Compliance with prescribed medications will improve Ability to identify triggers associated with substance abuse/mental health issues will improve Ability to identify changes in lifestyle to reduce recurrence of condition will improve Ability to verbalize feelings will improve Ability to disclose and discuss  suicidal ideas Ability to demonstrate self-control will improve  Ability to identify and develop effective coping behaviors will improve Ability to maintain clinical measurements within normal limits will improve Compliance with prescribed medications will improve Ability to identify triggers associated with substance abuse/mental health issues will improve  Medication Management: Evaluate patient's response, side effects, and tolerance of medication regimen.  Therapeutic Interventions: 1 to 1 sessions, Unit Group sessions and Medication administration.  Evaluation of Outcomes: Progressing   RN Treatment Plan for Primary Diagnosis: Substance-induced psychotic disorder ([place]) Long Term Goal(s): Knowledge of disease and therapeutic regimen to maintain health will improve  Short Term Goals: Ability to identify and develop effective coping behaviors will improve and Compliance with prescribed medications will improve  Medication Management: RN will administer medications as ordered by provider, will assess and evaluate patient's response and provide education to patient for prescribed medication. RN will report any adverse and/or side effects to prescribing provider.  Therapeutic Interventions: 1 on 1 counseling sessions, Psychoeducation, Medication administration, Evaluate responses to treatment, Monitor vital signs and CBGs as ordered, Perform/monitor CIWA, COWS, AIMS and Fall Risk screenings as ordered, Perform wound care treatments as ordered.  Evaluation of Outcomes: Progressing   LCSW Treatment Plan for Primary Diagnosis: Substance-induced psychotic disorder [facility]) Long Term Goal(s): Safe transition to appropriate next level of care at discharge, Engage patient in therapeutic group addressing interpersonal concerns.  Short Term Goals: Engage patient in aftercare planning with referrals and resources  Therapeutic Interventions: Assess for all discharge needs, 1 to 1 time with Social  worker, Explore available resources and support systems, Assess for adequacy in community support network, Educate family and significant other(s) on suicide prevention, Complete Psychosocial Assessment, Interpersonal group therapy.  Evaluation of Outcomes: Not Met   Progress in Treatment: Attending groups: Yes Participating in groups: Yes Taking medication as prescribed: Yes Toleration medication: Yes, no side effects reported at this time Family/Significant other contact made:  Patient understands diagnosis: Yes AEB Discussing patient identified problems/goals with staff: Yes Medical problems stabilized or resolved: Yes Denies suicidal/homicidal ideation: Yes Issues/concerns per patient self-inventory: None Other: N/A  New problem(s) identified: None identified at this time.   New Short Term/Long Term Goal(s):"Help me to better understand addiction"   Discharge Plan or Barriers:   Reason for Continuation of Hospitalization: Anxiety  Depression  Medication stabilization  Withdrawal symptoms  Estimated Length of Stay: 12/28  Attendees: Patient: Kaeden Depaz 08/05/2017  8:01 AM  Physician: Marchelle Folks MD 08/05/2017  8:01 AM  Nursing: Sena Hitch, RN 08/05/2017  8:01 AM  RN Care Manager: Lars Pinks, RN 08/05/2017  8:01 AM  Social Worker: Ripley Fraise 08/05/2017  8:01 AM  Recreational Therapist: Winfield Cunas 08/05/2017  8:01 AM  Other: Norberto Sorenson 08/05/2017  8:01 AM  Other:  08/05/2017  8:01 AM    Scribe for Treatment Team:  Roque Lias LCSW 08/05/2017 8:01 AM

## 2017-08-05 NOTE — Progress Notes (Signed)
Nursing Progress Note: 7p-7a D: Pt currently presents with a illogical/incoherent/blocking/tangential/lound and pressured speech affect and behavior. Pt states "I can't deal with it. I feel cool. You are so awesome. Life is good." Interacting inappropriately verbally with the milieu by yelling in random outburst nonsensical phrases and exclamations. Pt reports good sleep during the previous night with current medication regimen. Pt did attend wrap-up group.  A: Pt provided with medications per providers orders. Pt's labs and vitals were monitored throughout the night. Pt supported emotionally and encouraged to express concerns and questions. Pt educated on medications.  R: Pt's safety ensured with 15 minute and environmental checks. Pt currently denies SI, HI, and AVH. Pt verbally contracts to seek staff if SI,HI, or AVH occurs and to consult with staff before acting on any harmful thoughts. Will continue to monitor.

## 2017-08-05 NOTE — Progress Notes (Signed)
Recreation Therapy Notes  Date: 08/05/17 Time: 0930 Location: 300 Hall Group Room  Group Topic: Stress Management  Goal Area(s) Addresses:  Patient will verbalize importance of using healthy stress management.  Patient will identify positive emotions associated with healthy stress management.   Intervention: Stress Management  Activity :  Meditation.  LRT introduced the stress management technique of meditation.  LRT played a meditation from the Calm app.  Patients were to listen and follow along as meditation was played.  Education:  Stress Management, Discharge Planning.   Education Outcome: Acknowledges edcuation/In group clarification offered/Needs additional education  Clinical Observations/Feedback: Pt did not attend group.   Caroll RancherMarjette Voshon Petro, LRT/CTRS         Caroll RancherLindsay, Camryn Lampson A 08/05/2017 12:02 PM

## 2017-08-06 MED ORDER — RISPERIDONE 0.25 MG PO TABS
0.2500 mg | ORAL_TABLET | Freq: Every day | ORAL | Status: DC
  Administered 2017-08-06: 0.25 mg via ORAL
  Filled 2017-08-06 (×3): qty 1

## 2017-08-06 NOTE — Progress Notes (Signed)
D Patient slept soundly in her bed until 1215, at which point she came wandering down the hall, she was disoriented, dragging her feet, confused about where she was, why she was in the hospital and wimpering ' can't you make the vices go away.Marland Kitchen.ibuprofen just can't stand it...". A Pt oriented by Clinical research associatewriter to person, place and time. She did remember speaking to the MD earlier this am. She is unconsolable. She is willing to take po vistaril prn. R Safety in place. Pt given 2 glassses of ginger ale, medicated with prn vistaril and po scheduled celexa and returned to sleep and has been asleep since 1245.

## 2017-08-06 NOTE — Progress Notes (Signed)
Nursing Progress Note: 7p-7a D: Pt currently presents with a thought blocking/circumstantial/guarded/disorganized affect and behavior. Pt states "Today was a bad day, but it was good. I got good presents this year for Christmas." Interacting minimally with the milieu. Pt reports fair sleep during the previous night with current medication regimen. Pt did not attend wrap-up group.  A: Pt provided with medications per providers orders. Pt's labs and vitals were monitored throughout the night. Pt supported emotionally and encouraged to express concerns and questions. Pt educated on medications.  R: Pt's safety ensured with 15 minute and environmental checks. Pt currently denies SI, HI, and AVH. Pt verbally contracts to seek staff if SI,HI, or AVH occurs and to consult with staff before acting on any harmful thoughts. Will continue to monitor.

## 2017-08-06 NOTE — Progress Notes (Signed)
[facility] MD Progress Note  08/06/2017 9:42 AM Emily Barajas  MRN:  [id number] Subjective:  Alert, oriented, subuded, depressed, didn't sleep well HPI: As per initial admission note :21 y.o Caucasian female, single, no kids, unemployed, lives with her family. Background history of SUD. Presented to the unit via MCT. Reported to have been acting in a bizarre manner. Reported having tactile and auditory hallucination. Intoxicated with methamphetamine.      on evaluation today. Feels subdued sluggish. Says she was on a bad trip after using meth that made her irritable and impulsive  She is calmer but sleepy. Not hallucinating anymore Less anxious. celexa was started for depression  Principal Problem: Substance-induced psychotic disorder [facility]) Diagnosis:   Patient Active Problem List   Diagnosis Date Noted  . 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 Anxiety   . Asthma   . Headache   . Hepatitis   . Seizures ([place])    Pt does not remember when last one was, states "it's been awhile"   History reviewed. No pertinent surgical history. Family History: History reviewed. No pertinent family history. Family Psychiatric  History: see chart Social History:  Social History   Substance and Sexual Activity  Alcohol Use No  . Frequency: Never     Social History   Substance and Sexual Activity  Drug Use Yes  . Frequency: 7.0 times per week  . Types: Amphetamines, Marijuana    Social History   Socioeconomic History  . Marital status: Single    Spouse name: None  . Number of children: None  . Years of education: None  . Highest education level: None  Social Needs  . Financial resource strain: None  . Food insecurity - worry: None  . Food insecurity - inability: None  . Transportation needs - medical: None   . Transportation needs - non-medical: None  Occupational History  . None  Tobacco Use  . Smoking status: Current Every Day Smoker    Packs/day: 1.00    Types: Cigarettes  . Smokeless tobacco: Never Used  Substance and Sexual Activity  . Alcohol use: No    Frequency: Never  . Drug use: Yes    Frequency: 7.0 times per week    Types: Amphetamines, Marijuana  . Sexual activity: Not Currently  Other Topics Concern  . None  Social History Narrative  . None   Additional Social History:    Pain Medications: denies Prescriptions: denies Over the Counter: denies History of alcohol / drug use?: Yes Longest period of sobriety (when/how long): none reported Negative Consequences of Use: Financial, Legal, Personal relationships, Work / School Name of Substance 1: methamphetamine 1 - Age of First Use: 21 1 - Amount (size/oz): <1 gram 1 - Frequency: daily 1 - Duration: 2 mos 1 - Last Use / Amount: yesterday Name of Substance 2: Marijuana 2 - Age of First Use: 17 2 - Amount (size/oz): unknown 2 - Frequency: daily 2 - Duration: 4 years 2 - Last Use / Amount: unknown                Sleep: Fair  Appetite:  Fair  Current Medications: Current Facility-Administered Medications  Medication Dose Route Frequency Provider Last Rate Last Dose  . acetaminophen (TYLENOL) tablet 650 mg  650 mg Oral Q6H PRN Rozetta Nunnery, NP      .  alum & mag hydroxide-simeth (MAALOX/MYLANTA) 200-200-20 MG/5ML suspension 30 mL  30 mL Oral Q4H PRN Lindon Romp A, NP      . citalopram (CELEXA) tablet 10 mg  10 mg Oral Daily Merian Capron, MD   10 mg at 08/05/17 1707  . hydrOXYzine (ATARAX/VISTARIL) tablet 25 mg  25 mg Oral TID PRN Rozetta Nunnery, NP   25 mg at 08/05/17 1851  . magnesium hydroxide (MILK OF MAGNESIA) suspension 30 mL  30 mL Oral Daily PRN Lindon Romp A, NP      . nicotine polacrilex (NICORETTE) gum 2 mg  2 mg Oral PRN Cobos, Myer Peer, MD      . risperiDONE (RISPERDAL) tablet 0.5 mg   0.5 mg Oral BID Izediuno, Laruth Bouchard, MD   0.5 mg at 08/05/17 1707  . traZODone (DESYREL) tablet 50 mg  50 mg Oral QHS,MR X 1 Lindon Romp A, NP   50 mg at 08/03/17 2221    Lab Results:  Results for orders placed or performed during the hospital encounter of 08/03/17 (from the past 48 hour(s))  Comprehensive metabolic panel     Status: Abnormal   Collection Time: 08/04/17  6:45 PM  Result Value Ref Range   Sodium 138 135 - 145 mmol/L   Potassium 4.2 3.5 - 5.1 mmol/L   Chloride 110 101 - 111 mmol/L   CO2 23 22 - 32 mmol/L   Glucose, Bld 88 65 - 99 mg/dL   BUN 16 6 - 20 mg/dL   Creatinine, Ser 0.90 0.44 - 1.00 mg/dL   Calcium 8.5 (L) 8.9 - 10.3 mg/dL   Total Protein 6.1 (L) 6.5 - 8.1 g/dL   Albumin 3.2 (L) 3.5 - 5.0 g/dL   AST 21 15 - 41 U/L   ALT 18 14 - 54 U/L   Alkaline Phosphatase 56 38 - 126 U/L   Total Bilirubin 0.8 0.3 - 1.2 mg/dL   GFR calc non Af Amer >60 >60 mL/min   GFR calc Af Amer >60 >60 mL/min    Comment: (NOTE) The eGFR has been calculated using the CKD EPI equation. This calculation has not been validated in all clinical situations. eGFR's persistently <60 mL/min signify possible Chronic Kidney Disease.    Anion gap 5 5 - 15    Comment: Performed at [facility], [place] [address], [place], [place] [address]  Rapid urine drug screen (hospital performed)     Status: Abnormal   Collection Time: 08/04/17  8:44 PM  Result Value Ref Range   Opiates NONE DETECTED NONE DETECTED   Cocaine NONE DETECTED NONE DETECTED   Benzodiazepines NONE DETECTED NONE DETECTED   Amphetamines POSITIVE (A) NONE DETECTED   Tetrahydrocannabinol POSITIVE (A) NONE DETECTED   Barbiturates NONE DETECTED NONE DETECTED    Comment: (NOTE) DRUG SCREEN FOR MEDICAL PURPOSES ONLY.  IF CONFIRMATION IS NEEDED FOR ANY PURPOSE, NOTIFY LAB WITHIN 5 DAYS. LOWEST DETECTABLE LIMITS FOR URINE DRUG SCREEN Drug Class                     Cutoff (ng/mL) Amphetamine and metabolites     1000 Barbiturate and metabolites    200 Benzodiazepine                 921 Tricyclics and metabolites     300 Opiates and metabolites        300 Cocaine and metabolites        300 THC  50 Performed at [facility], [place] [address] [place], [place] [address]   Pregnancy, urine     Status: None   Collection Time: 08/04/17  8:44 PM  Result Value Ref Range   Preg Test, Ur NEGATIVE NEGATIVE    Comment:        THE SENSITIVITY OF THIS METHODOLOGY IS >20 mIU/mL. Performed at [facility], [place] [address] [place], [place] [address]   Lipid panel     Status: None   Collection Time: 08/05/17  6:10 AM  Result Value Ref Range   Cholesterol 153 0 - 200 mg/dL   Triglycerides 53 <150 mg/dL   HDL 49 >40 mg/dL   Total CHOL/HDL Ratio 3.1 RATIO   VLDL 11 0 - 40 mg/dL   LDL Cholesterol 93 0 - 99 mg/dL    Comment:        Total Cholesterol/HDL:CHD Risk Coronary Heart Disease Risk Table                     Men   Women  1/2 Average Risk   3.4   3.3  Average Risk       5.0   4.4  2 X Average Risk   9.6   7.1  3 X Average Risk  23.4   11.0        Use the calculated Patient Ratio above and the CHD Risk Table to determine the patient's CHD Risk.        ATP III CLASSIFICATION (LDL):  <100     mg/dL   Optimal  100-129  mg/dL   Near or Above                    Optimal  130-159  mg/dL   Borderline  160-189  mg/dL   High  >190     mg/dL   Very High Performed at [place] [address] [place], [place] [address]   CBC with Differential/Platelet     Status: None   Collection Time: 08/05/17  6:10 AM  Result Value Ref Range   WBC 7.5 4.0 - 10.5 K/uL   RBC 4.75 3.87 - 5.11 MIL/uL   Hemoglobin 13.8 12.0 - 15.0 g/dL   HCT 41.4 36.0 - 46.0 %   MCV 87.2 78.0 - 100.0 fL   MCH 29.1 26.0 - 34.0 pg   MCHC 33.3 30.0 - 36.0 g/dL   RDW 12.8 11.5 - 15.5 %   Platelets 287 150 - 400 K/uL   Neutrophils Relative % 49 %    Neutro Abs 3.7 1.7 - 7.7 K/uL   Lymphocytes Relative 43 %   Lymphs Abs 3.2 0.7 - 4.0 K/uL   Monocytes Relative 6 %   Monocytes Absolute 0.5 0.1 - 1.0 K/uL   Eosinophils Relative 2 %   Eosinophils Absolute 0.1 0.0 - 0.7 K/uL   Basophils Relative 0 %   Basophils Absolute 0.0 0.0 - 0.1 K/uL    Comment: Performed at [facility], [place] [address] [place], [place] [address]    Blood Alcohol level:  No results found for: [facility]  Metabolic Disorder Labs: No results found for: HGBA1C, MPG No results found for: PROLACTIN Lab Results  Component Value Date   CHOL 153 08/05/2017   TRIG 53 08/05/2017   HDL 49 08/05/2017   CHOLHDL 3.1 08/05/2017   VLDL 11 08/05/2017   LDLCALC 93 08/05/2017    Physical Findings:  AIMS: Facial and Oral Movements Muscles of Facial Expression: Mild Lips and Perioral Area: Mild Jaw: Mild Tongue: Mild,Extremity Movements Upper (arms, wrists, hands, fingers): None, normal Lower (legs, knees, ankles, toes): None, normal, Trunk Movements Neck, shoulders, hips: None, normal, Overall Severity Severity of abnormal movements (highest score from questions above): Mild Incapacitation due to abnormal movements: None, normal Patient's awareness of abnormal movements (rate only patient's report): Aware, mild distress, Dental Status Current problems with teeth and/or dentures?: No Does patient usually wear dentures?: No  CIWA:  CIWA-Ar Total: 4 COWS:     Musculoskeletal: Strength & Muscle Tone: within normal limits Gait & Station: normal Patient leans: no lean  Psychiatric Specialty Exam: Physical Exam  Constitutional: She appears well-developed.    Review of Systems  Cardiovascular: Negative for palpitations.  Skin: Negative for rash.  Psychiatric/Behavioral: Positive for depression and substance abuse. The patient is nervous/anxious.     Blood pressure 106/77, pulse (!) 101, temperature 98.4 F (36.9 C), temperature source Oral, resp. rate  18, height '5\' 1"'  (1.549 m), weight 54 kg (119 lb).Body mass index is 22.48 kg/m.  General Appearance: Casual  Eye Contact:  Fair  Speech:  Normal Rate  Volume:  Decreased  Mood:  subdued  Affect:  Congruent  Thought Process:  Goal Directed  Orientation:  Full (Time, Place, and Person)  Thought Content:  Paranoid Ideation  Suicidal Thoughts:  No  Homicidal Thoughts:  No  Memory:  Immediate;   Fair Recent;   Fair  Judgement:  Poor  Insight:  Shallow  Psychomotor Activity:  Normal  Concentration:  Concentration: Fair and Attention Span: Fair  Recall:  [employer] of Knowledge:  Fair  Language:  Fair  Akathisia:  Negative  Handed:  Right  AIMS (if indicated):     Assets:  Desire for Improvement Social Support  ADL's:  Intact  Cognition:  WNL  Sleep:  Number of Hours: 6.75     Treatment Plan Summary: Daily contact with patient to assess and evaluate symptoms and progress in treatment, Medication management and Plan as follows  1. Substance induced psychotic disorder: improved. Will lower risperdal to at night only and can dc in 2 days 2. Mood disorder ; subdued. Continue celexa. It has helped Encourage support groups and unit activities.   Merian Capron, MD 08/06/2017, 9:42 AM

## 2017-08-06 NOTE — Progress Notes (Signed)
The patient did not attend group since she was asleep.

## 2017-08-07 DIAGNOSIS — Z560 Unemployment, unspecified: Secondary | ICD-10-CM

## 2017-08-07 DIAGNOSIS — F1210 Cannabis abuse, uncomplicated: Secondary | ICD-10-CM

## 2017-08-07 MED ORDER — LORAZEPAM 2 MG/ML IJ SOLN: 0.5000 mg | Freq: Four times a day (QID) | INTRAMUSCULAR | Status: DC | PRN

## 2017-08-07 MED ORDER — OLANZAPINE 5 MG PO TBDP
5.0000 mg | ORAL_TABLET | Freq: Three times a day (TID) | ORAL | Status: DC | PRN
  Administered 2017-08-07 – 2017-08-11 (×5): 5 mg via ORAL
  Filled 2017-08-07 (×5): qty 1

## 2017-08-07 MED ORDER — LORAZEPAM 0.5 MG PO TABS
0.5000 mg | ORAL_TABLET | Freq: Four times a day (QID) | ORAL | Status: DC | PRN
  Administered 2017-08-08 – 2017-08-11 (×5): 0.5 mg via ORAL
  Filled 2017-08-07 (×5): qty 1

## 2017-08-07 MED ORDER — LORAZEPAM 0.5 MG PO TABS: 0.5000 mg | ORAL_TABLET | Freq: Four times a day (QID) | ORAL | Status: DC | PRN

## 2017-08-07 NOTE — Progress Notes (Signed)
Pt presents with an animated affect and a labile mood. Pt noted to be wide eyed, preoccupied and bizarre today. Pt observed fidgety, making weird gestures and posturing. Pt noted to be loud, afraid and having crying spells off and on in the hallway throughout the day. Pt disruptive to the milieu as other pts have complained about pt talking to herself loudly as she paces the hallway. Pt requires redirecting for bizarre behaviors. Pt given prn meds for agitation.  Medications reviewed with pt. Medications administered as ordered per MD. Verbal support provided. Pt encouraged to attend groups as tolerated. 15 minute checks performed for safety.

## 2017-08-07 NOTE — Progress Notes (Signed)
Recreation Therapy Notes  Date: 08/07/17 Time: 0930 Location: 300 Hall Dayroom  Group Topic: Stress Management  Goal Area(s) Addresses:  Patient will verbalize importance of using healthy stress management.  Patient will identify positive emotions associated with healthy stress management.   Intervention: Stress Management  Activity :  Meditation.  LRT read a meditation to help patients visualize the strength and endurance mountains have and how they can use that same strength to get through the things they are faced with.  Education:  Stress Management, Discharge Planning.   Education Outcome: Acknowledges edcuation/In group clarification offered/Needs additional education  Clinical Observations/Feedback: Pt did not attend group.    Jaleena Viviani, LRT/CTRS         Danene Montijo A 08/07/2017 11:20 AM 

## 2017-08-07 NOTE — Progress Notes (Signed)
[facility]  08/07/2017 10:33 AM Emily FewVirginia Barajas  MRN:  [id number]   Subjective:  Patient reports that she is doing okay this morning, but still feeling "groggy" first thing in the morning. She admits to good sleep and good appetite. She denies any SI/HI/AVH and contracts for safety. She plans to complete detox form Methamphetamine and then discharge home.   Objective: Patient's chart and findings reviewed and discussed with treatment team. Patient presents in her bed appearing sleepy. She awakens easily and is cooperative. Plan is to continue Risperidone 0.25 mg QHS and will discontinue tomorrow. Will continue Celexa 10 mg Daily.   Principal Problem: Substance-induced psychotic disorder [facility]) Diagnosis:   Patient Active Problem List   Diagnosis Date Noted  . 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 Anxiety   . Asthma   . Headache   . Hepatitis   . Seizures ([facility])    Pt does not remember when last one was, states "it's been awhile"   History reviewed. No pertinent surgical history. Family History: History reviewed. No pertinent family history. Family Psychiatric  History: See H& Social History:  Social History   Substance and Sexual Activity  Alcohol Use No  . Frequency: Never     Social History   Substance and Sexual Activity  Drug Use Yes  . Frequency: 7.0 times per week  . Types: Amphetamines, Marijuana    Social History   Socioeconomic History  . Marital status: Single    Spouse name: None  . Number of children: None  . Years of education: None  . Highest education level: None  Social Needs  . Financial resource strain: None  . Food insecurity - worry: None  . Food insecurity - inability: None  . Transportation needs - medical: None  . Transportation needs - non-medical: None   Occupational History  . None  Tobacco Use  . Smoking status: Current Every Day Smoker    Packs/day: 1.00    Types: Cigarettes  . Smokeless tobacco: Never Used  Substance and Sexual Activity  . Alcohol use: No    Frequency: Never  . Drug use: Yes    Frequency: 7.0 times per week    Types: Amphetamines, Marijuana  . Sexual activity: Not Currently  Other Topics Concern  . None  Social History Narrative  . None   Additional Social History:    Pain Medications: denies Prescriptions: denies Over the Counter: denies History of alcohol / drug use?: Yes Longest period of sobriety (when/how long): none reported Negative Consequences of Use: Financial, Legal, Personal relationships, Work / School Name of Substance 1: methamphetamine 1 - Age of First Use: 21 1 - Amount (size/oz): <1 gram 1 - Frequency: daily 1 - Duration: 2 mos 1 - Last Use / Amount: yesterday Name of Substance 2: Marijuana 2 - Age of First Use: 17 2 - Amount (size/oz): unknown 2 - Frequency: daily 2 - Duration: 4 years 2 - Last Use / Amount: unknown                Sleep: Good  Appetite:  Good  Current Medications: Current Facility-Administered Medications  Medication Dose Route Frequency Provider Last Rate Last Dose  . acetaminophen (TYLENOL) tablet 650 mg  650 mg Oral Q6H PRN Jackelyn PolingBerry, Jason A, NP      . alum &  mag hydroxide-simeth (MAALOX/MYLANTA) 200-200-20 MG/5ML suspension 30 mL  30 mL Oral Q4H PRN Nira ConnBerry, Jason A, NP      . citalopram (CELEXA) tablet 10 mg  10 mg Oral Daily Thresa RossAkhtar, Nadeem, MD   10 mg at 08/06/17 1348  . hydrOXYzine (ATARAX/VISTARIL) tablet 25 mg  25 mg Oral TID PRN Jackelyn PolingBerry, Jason A, NP   25 mg at 08/06/17 2131  . magnesium hydroxide (MILK OF MAGNESIA) suspension 30 mL  30 mL Oral Daily PRN Nira ConnBerry, Jason A, NP      . nicotine polacrilex (NICORETTE) gum 2 mg  2 mg Oral PRN Cobos, Rockey SituFernando A, MD      . risperiDONE (RISPERDAL) tablet 0.25 mg  0.25 mg Oral QHS Thresa RossAkhtar, Nadeem, MD   0.25 mg  at 08/06/17 2131  . traZODone (DESYREL) tablet 50 mg  50 mg Oral QHS,MR X 1 Nira ConnBerry, Jason A, NP   50 mg at 08/06/17 2132    Lab Results: No results found for this or any previous visit (from the past 48 hour(s)).  Blood Alcohol level:  No results found for: [facility]  Metabolic Disorder Labs: No results found for: HGBA1C, MPG No results found for: PROLACTIN Lab Results  Component Value Date   CHOL 153 08/05/2017   TRIG 53 08/05/2017   HDL 49 08/05/2017   CHOLHDL 3.1 08/05/2017   VLDL 11 08/05/2017   LDLCALC 93 08/05/2017    Physical Findings: AIMS: Facial and Oral Movements Muscles of Facial Expression: Mild Lips and Perioral Area: Mild Jaw: Mild Tongue: Mild,Extremity Movements Upper (arms, wrists, hands, fingers): None, normal Lower (legs, knees, ankles, toes): None, normal, Trunk Movements Neck, shoulders, hips: None, normal, Overall Severity Severity of abnormal movements (highest score from questions above): Mild Incapacitation due to abnormal movements: None, normal Patient's awareness of abnormal movements (rate only patient's report): Aware, mild distress, Dental Status Current problems with teeth and/or dentures?: No Does patient usually wear dentures?: No  CIWA:  CIWA-Ar Total: 4 COWS:     Musculoskeletal: Strength & Muscle Tone: within normal limits Gait & Station: normal Patient leans: N/A  Psychiatric Specialty Exam: Physical Exam  Nursing note and vitals reviewed. Constitutional: She is oriented to person, place, and time. She appears well-developed and well-nourished.  Respiratory: Effort normal.  Musculoskeletal: Normal range of motion.  Neurological: She is alert and oriented to person, place, and time.  Skin: Skin is warm.    Review of Systems  Constitutional: Negative.   HENT: Negative.   Eyes: Negative.   Respiratory: Negative.   Cardiovascular: Negative.   Gastrointestinal: Negative.   Genitourinary: Negative.   Musculoskeletal: Negative.    Skin: Negative.   Neurological: Negative.   Endo/Heme/Allergies: Negative.   Psychiatric/Behavioral: Negative.     Blood pressure 106/77, pulse (!) 101, temperature 98.4 F (36.9 C), temperature source Oral, resp. rate 18, height 5\' 1"  (1.549 m), weight 54 kg (119 lb).Body mass index is 22.48 kg/m.  General Appearance: Disheveled  Eye Contact:  Fair  Speech:  Clear and Coherent and Normal Rate  Volume:  Decreased  Mood:  Euthymic  Affect:  Flat  Thought Process:  Goal Directed and Descriptions of Associations: Intact  Orientation:  Full (Time, Place, and Person)  Thought Content:  WDL  Suicidal Thoughts:  No  Homicidal Thoughts:  No  Memory:  Immediate;   Good Recent;   Good Remote;   Good  Judgement:  Fair  Insight:  Fair  Psychomotor Activity:  Normal  Concentration:  Concentration: Good and  Attention Span: Good  Recall:  Good  Fund of Knowledge:  Good  Language:  Good  Akathisia:  No  Handed:  Right  AIMS (if indicated):     Assets:  Communication Skills Desire for Improvement Financial Resources/Insurance Housing Physical Health Social Support Transportation  ADL's:  Intact  Cognition:  WNL  Sleep:  Number of Hours: 6.75   Problems Addressed: Substance-induced psychotic disorder   Treatment Plan Summary: Daily contact with patient to assess and evaluate symptoms and progress in treatment, Medication management and Plan is to:  -Continue Risperidone 0.25 mg PO QHS for mood stability -Continue Celexa 10 mg PO Daily for mood stability -Continue Trazodone 50 mg PO QHS PRN for insomnia -Encourage group therapy participation   Maryfrances Bunnell, FNP 08/07/2017, 10:33 AM   Addendum - 08/07/17 2,00 PM  Asked to evaluate patient due to agitated  behavior. Saw patient with RN and with NP. Patient noted to be crying loudly, pacing , speaking to self , stomping feet on floor. Initially presented fearful, anxious on approach, reporting ongoing psychotic symptoms,  stating that she is " freaking out" because she feels that her eyes appear unusual to her when she looks in the mirror. States " when I am close to the mirror I can see my eyes, but if I move away its like they are still there"-she also describes vague auditory hallucinations, without any specific content . Does not appear internally preoccupied at present. She is alert, attentive, oriented x 3, able to answer questions appropriately, and there is no evidence of delirium. Although agitated, some behavioral component is suspected as she appeared significantly calmer at times, becoming  more symptomatic when being observed . Patient calmed down gradually /significantly  with support , reassurance.  Vitals were stable. No tremors, no diaphoresis. ( Denies any recent alcohol or BZD abuse ) . Of note, Risperidone has been tapered down to current 0.25 mgrs QHS . Patient reports history of substance abuse- admission UDS positive for amphetamines and Cannabis. She acknowledges psychotic symptoms occur when using drugs, but states she feels she has unusual experiences as above even when sober .  Episode consistent with panic attack and residual psychotic symptoms.   Plan- discontinue Risperidone. Zyprexa Zydis 5 mgrs x 1 . Start Ativan 0.5 mgrs Q 6 hours PRN for anxiety.  Sallyanne Havers, MD

## 2017-08-07 NOTE — Tx Team (Signed)
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 acetaminophen (TYLENOL) tablet 650 mg  650 mg Oral Q6H PRN Nira ConnBerry, Jason A, NP      . alum & mag hydroxide-simeth (MAALOX/MYLANTA) 200-200-20 MG/5ML suspension 30 mL  30 mL Oral Q4H PRN Nira ConnBerry, Jason A, NP      . citalopram (CELEXA) tablet 10 mg  10 mg Oral Daily Thresa RossAkhtar, Nadeem, MD   10 mg at 08/06/17 1348  . hydrOXYzine (ATARAX/VISTARIL) tablet 25 mg  25 mg Oral TID PRN Jackelyn PolingBerry, Jason A, NP   25 mg at 08/06/17 2131  . magnesium hydroxide (MILK OF MAGNESIA) suspension 30 mL  30 mL Oral Daily PRN Nira ConnBerry, Jason A, NP      . nicotine polacrilex (NICORETTE) gum 2 mg  2 mg Oral PRN Cobos, Rockey SituFernando A, MD      . risperiDONE (RISPERDAL) tablet 0.25 mg  0.25 mg Oral QHS Thresa RossAkhtar, Nadeem, MD   0.25 mg at 08/06/17 2131  . traZODone (DESYREL) tablet 50 mg  50 mg Oral QHS,MR X 1 Nira ConnBerry, Jason A, NP   50 mg at 08/06/17 2132   PTA Medications: No medications prior to admission.    Patient Stressors: Financial difficulties Health problems Loss of Mother Substance abuse  Patient Strengths: Ability for insight Communication skills  Treatment Modalities: Medication Management, Group therapy, Case management,  1 to 1 session with clinician, Psychoeducation, Recreational therapy.   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) Long Term Goal(s): Improvement in symptoms so as ready for discharge Improvement in symptoms so as ready for discharge   Short Term Goals: Ability to identify changes in lifestyle to reduce recurrence of condition will improve Ability to  verbalize feelings will improve Ability to disclose and discuss suicidal ideas Ability to demonstrate self-control will improve Ability to identify and develop effective coping behaviors will improve Ability to maintain clinical measurements within normal limits will improve Compliance with prescribed medications will improve Ability to identify triggers associated with substance abuse/mental health issues will improve Ability to identify changes in lifestyle to reduce recurrence of condition will improve Ability to verbalize feelings will improve Ability to disclose and discuss suicidal ideas Ability to demonstrate self-control will improve Ability to identify and develop effective coping behaviors will improve Ability to maintain clinical measurements within normal limits will improve Compliance with prescribed medications will improve Ability to identify triggers associated with substance abuse/mental health issues will improve  Medication Management: Evaluate patient's response, side effects, and tolerance of medication regimen.  Therapeutic Interventions: 1 to 1 sessions, Unit Group sessions and Medication administration.  Evaluation of Outcomes: Progressing  Physician Treatment Plan for Secondary Diagnosis: Principal Problem:   Substance-induced psychotic disorder (HCC)  Long Term Goal(s): Improvement in symptoms so as ready for discharge Improvement in symptoms so as ready for discharge   Short Term Goals: Ability to identify changes in lifestyle to reduce recurrence of condition will improve Ability to verbalize feelings will improve Ability to disclose and discuss suicidal ideas Ability to demonstrate self-control will improve Ability to identify and develop effective coping behaviors will improve Ability to maintain clinical measurements within normal limits will improve Compliance with prescribed medications will improve Ability to identify triggers associated with  substance  abuse/mental health issues will improve Ability to identify changes in lifestyle to reduce recurrence of condition will improve Ability to verbalize feelings will improve Ability to disclose and discuss suicidal ideas Ability to demonstrate self-control will improve Ability to identify and develop effective coping behaviors will improve Ability to maintain clinical measurements within normal limits will improve Compliance with prescribed medications will improve Ability to identify triggers associated with substance abuse/mental health issues will improve     Medication Management: Evaluate patient's response, side effects, and tolerance of medication regimen.  Therapeutic Interventions: 1 to 1 sessions, Unit Group sessions and Medication administration.  Evaluation of Outcomes: Progressing   RN Treatment Plan for Primary Diagnosis: Substance-induced psychotic disorder (HCC) Long Term Goal(s): Knowledge of disease and therapeutic regimen to maintain health will improve  Short Term Goals: Ability to identify and develop effective coping behaviors will improve and Compliance with prescribed medications will improve  Medication Management: RN will administer medications as ordered by provider, will assess and evaluate patient's response and provide education to patient for prescribed medication. RN will report any adverse and/or side effects to prescribing provider.  Therapeutic Interventions: 1 on 1 counseling sessions, Psychoeducation, Medication administration, Evaluate responses to treatment, Monitor vital signs and CBGs as ordered, Perform/monitor CIWA, COWS, AIMS and Fall Risk screenings as ordered, Perform wound care treatments as ordered.  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  Short Term Goals: Engage patient in aftercare planning with referrals and resources, Increase social support and Increase skills for wellness and recovery  Therapeutic Interventions: Assess for all discharge needs, 1 to 1 time with Social worker, Explore available resources and support systems, Assess for adequacy in community support network, Educate family and significant other(s) on suicide prevention, Complete Psychosocial Assessment, Interpersonal group therapy.  Evaluation of Outcomes: Progressing   Progress in Treatment: Attending groups: No. Participating in groups: No. Taking medication as prescribed: Yes. Toleration medication: Yes. Family/Significant other contact made: No, will contact:  when given permission Patient understands diagnosis: Yes. Discussing patient identified problems/goals with staff: Yes. Medical problems stabilized or resolved: Yes. Denies suicidal/homicidal ideation: Yes. Issues/concerns per patient self-inventory: No. Other: none  New problem(s) identified: No, Describe:  none  New Short Term/Long Term Goal(s):  Discharge Plan or Barriers:   Reason for Continuation of Hospitalization: Medication stabilization  Estimated Length of Stay: 3-5 days.   Attendees: Patient: 08/07/2017   Physician: Dr Jama Flavorsobos, MD 08/07/2017  Nursing: Joslyn Devonaroline Beaudry, RN 08/07/2017   RN Care Manager: 08/07/2017   Social Worker: Daleen SquibbGreg Hamad Whyte, LCSW 08/07/2017   Recreational Therapist:  08/07/2017   Other:  08/07/2017   Other:  08/07/2017   Other: 08/07/2017          Scribe for Treatment Team: Lorri FrederickWierda, Anaaya Fuster Jon, LCSW 08/07/2017 1:33 PM

## 2017-08-07 NOTE — Progress Notes (Signed)
Psychoeducational Group Note  Date:  08/07/2017 Time: 2212  Group Topic/Focus:  Wrap-Up Group:   The focus of this group is to help patients review their daily goal of treatment and discuss progress on daily workbooks.  Participation Level: Did Not Attend  Participation Quality:  Not Applicable  Affect:  Not Applicable  Cognitive:  Not Applicable  Insight:  Not Applicable  Engagement in Group: Not Applicable  Additional Comments: The patient did not attend the evening Wrap-Up group since she was asleep in her bedroom.   Hazle CocaGOODMAN, Doral Digangi S 08/07/2017, 10:12 PM

## 2017-08-08 DIAGNOSIS — F39 Unspecified mood [affective] disorder: Secondary | ICD-10-CM

## 2017-08-08 DIAGNOSIS — G4700 Insomnia, unspecified: Secondary | ICD-10-CM

## 2017-08-08 MED ORDER — OLANZAPINE 5 MG PO TABS
5.0000 mg | ORAL_TABLET | Freq: Every day | ORAL | Status: DC
  Administered 2017-08-08 – 2017-08-11 (×4): 5 mg via ORAL
  Filled 2017-08-08 (×4): qty 1
  Filled 2017-08-08: qty 2
  Filled 2017-08-08 (×2): qty 1
  Filled 2017-08-08: qty 2

## 2017-08-08 MED ORDER — TRAZODONE HCL 50 MG PO TABS
50.0000 mg | ORAL_TABLET | Freq: Every evening | ORAL | Status: DC | PRN
  Administered 2017-08-08 – 2017-08-11 (×5): 50 mg via ORAL
  Filled 2017-08-08 (×3): qty 1

## 2017-08-08 MED ORDER — OLANZAPINE 2.5 MG PO TABS
2.5000 mg | ORAL_TABLET | ORAL | Status: DC
  Administered 2017-08-09 – 2017-08-12 (×4): 2.5 mg via ORAL
  Filled 2017-08-08 (×7): qty 1

## 2017-08-08 NOTE — Progress Notes (Signed)
Per patient request, [facility] referral made.  Trula SladeHeather Smart, MSW, LCSW Clinical Social Worker 08/08/2017 3:04 PM

## 2017-08-08 NOTE — Progress Notes (Signed)
Nursing Progress Note 2300-0730  D) Patient is resting in bed with no complaints at this time. Respirations even and unlabored. Patient medicated with PRN trazodone by previous RN. No PRN medications provided by writer at this time. Patient for lab draw this morning.  A) Labs, vital signs and patient behavior monitored throughout shift. Patient safety maintained with q15 min safety checks. Low fall risk precautions in place.  R) Patient remains safe on the unit at this time. Patient is resting in bed without complaints. Will continue to monitor.

## 2017-08-08 NOTE — Progress Notes (Signed)
Pt presents with a sad affect and a depressed mood on approach. Pt noted to have minimal interaction this morning. Pt does not appear to be responding to internal stimuli compared to yesterday. No agitation or bizarre behaviors noted during interaction with pt this morning. Pt reported difficulty sleeping last night due to having nightmares. Pt denies SI. Medications reviewed with pt. Medications administered as ordered per MD. Verbal support provided. Pt encouraged to attend groups. 15 minute checks performed for safety.  Pt compliant with tx.

## 2017-08-08 NOTE — Progress Notes (Signed)
Pt exhibiting bizarre behaviors at this time. Pt fidgety, posturing and making weird gestures. Pt stated "My eyes, can you see my eyes". Pt then became tearful and panicky while talking with staff. Pt given Zyprexa prn for agitation.

## 2017-08-08 NOTE — Progress Notes (Addendum)
[facility] MD Progress Note  08/08/2017 3:11 PM Emily Barajas  MRN:  [id number]   Subjective:  Patient reports ongoing psychotic symptoms. Although has difficulty describing her symptoms, reports feeling people's eyes and pupils look strange and different, reports feeling like some people are " like zombies ".  Denies medication side effects. Denies current substance ( methamphetamine is substance of choice ) cravings and expresses some insight regarding the negative impact substance abuse is having on her mental health. Denies suicidal ideations .  Objective: I have discussed case with treatment team and have met with patient. Patient presents partially improved compared to yesterday- overall presents better groomed, calmer, less labile . As above, she continues to endorse psychotic symptoms and does present with some thought disorder/tangentiality with open ended questions. She remains briefly tearful at times, but improved compared to yesterday's presentation and with a more reactive affect. She expresses improving insight regarding substance abuse and its negative impact on her mental health, and states " I was using a lot of meth, I know its bad for me". At this time is unable to say if psychotic symptoms predated substance abuse or are mainly substance induced . She also associates drug abuse with unprotected sexual activity and is expressing wanting to be tested for STDs, but denies any current symptoms. Denies medication side effects, and tolerated Zyprexa well .    Principal Problem: Substance-induced psychotic disorder [facility]) Diagnosis:   Patient Active Problem List   Diagnosis Date Noted  . Substance-induced psychotic disorder [facility]) [F19.959] 08/03/2017   Total Time spent with patient: 20 minutes  Past Psychiatric History: See H&P  Past Medical History:  Past Medical History:  Diagnosis Date  . Anxiety   . Asthma   . Headache   . Hepatitis   . Seizures ([place])    Pt does not  remember when last one was, states "it's been awhile"   History reviewed. No pertinent surgical history. Family History: History reviewed. No pertinent family history. Family Psychiatric  History: See H& Social History:  Social History   Substance and Sexual Activity  Alcohol Use No  . Frequency: Never     Social History   Substance and Sexual Activity  Drug Use Yes  . Frequency: 7.0 times per week  . Types: Amphetamines, Marijuana    Social History   Socioeconomic History  . Marital status: Single    Spouse name: None  . Number of children: None  . Years of education: None  . Highest education level: None  Social Needs  . Financial resource strain: None  . Food insecurity - worry: None  . Food insecurity - inability: None  . Transportation needs - medical: None  . Transportation needs - non-medical: None  Occupational History  . None  Tobacco Use  . Smoking status: Current Every Day Smoker    Packs/day: 1.00    Types: Cigarettes  . Smokeless tobacco: Never Used  Substance and Sexual Activity  . Alcohol use: No    Frequency: Never  . Drug use: Yes    Frequency: 7.0 times per week    Types: Amphetamines, Marijuana  . Sexual activity: Not Currently  Other Topics Concern  . None  Social History Narrative  . None   Additional Social History:    Pain Medications: denies Prescriptions: denies Over the Counter: denies History of alcohol / drug use?: Yes Longest period of sobriety (when/how long): none reported Negative Consequences of Use: Financial, Legal, Personal relationships, Work / School Name of  Substance 1: methamphetamine 1 - Age of First Use: 21 1 - Amount (size/oz): <1 gram 1 - Frequency: daily 1 - Duration: 2 mos 1 - Last Use / Amount: yesterday Name of Substance 2: Marijuana 2 - Age of First Use: 17 2 - Amount (size/oz): unknown 2 - Frequency: daily 2 - Duration: 4 years 2 - Last Use / Amount: unknown  Sleep: Fair- improving   Appetite:   Fair  Current Medications: Current Facility-Administered Medications  Medication Dose Route Frequency Provider Last Rate Last Dose  . acetaminophen (TYLENOL) tablet 650 mg  650 mg Oral Q6H PRN Lindon Romp A, NP      . alum & mag hydroxide-simeth (MAALOX/MYLANTA) 200-200-20 MG/5ML suspension 30 mL  30 mL Oral Q4H PRN Lindon Romp A, NP      . citalopram (CELEXA) tablet 10 mg  10 mg Oral Daily Merian Capron, MD   10 mg at 08/08/17 0756  . LORazepam (ATIVAN) tablet 0.5 mg  0.5 mg Oral Q6H PRN Money, Lowry Ram, FNP      . magnesium hydroxide (MILK OF MAGNESIA) suspension 30 mL  30 mL Oral Daily PRN Lindon Romp A, NP      . nicotine polacrilex (NICORETTE) gum 2 mg  2 mg Oral PRN Aero Drummonds, Myer Peer, MD      . OLANZapine zydis (ZYPREXA) disintegrating tablet 5 mg  5 mg Oral Q8H PRN Money, Lowry Ram, FNP   5 mg at 08/08/17 1448  . traZODone (DESYREL) tablet 50 mg  50 mg Oral QHS,MR X 1 Lindon Romp A, NP   50 mg at 08/07/17 2206    Lab Results: No results found for this or any previous visit (from the past 48 hour(s)).  Blood Alcohol level:  No results found for: [place] Day  Metabolic Disorder Labs: No results found for: HGBA1C, MPG No results found for: PROLACTIN Lab Results  Component Value Date   CHOL 153 08/05/2017   TRIG 53 08/05/2017   HDL 49 08/05/2017   CHOLHDL 3.1 08/05/2017   VLDL 11 08/05/2017   LDLCALC 93 08/05/2017    Physical Findings: AIMS: Facial and Oral Movements Muscles of Facial Expression: Mild Lips and Perioral Area: Mild Jaw: Mild Tongue: Mild,Extremity Movements Upper (arms, wrists, hands, fingers): None, normal Lower (legs, knees, ankles, toes): None, normal, Trunk Movements Neck, shoulders, hips: None, normal, Overall Severity Severity of abnormal movements (highest score from questions above): Mild Incapacitation due to abnormal movements: None, normal Patient's awareness of abnormal movements (rate only patient's report): Aware, mild distress, Dental  Status Current problems with teeth and/or dentures?: No Does patient usually wear dentures?: No  CIWA:  CIWA-Ar Total: 4 COWS:     Musculoskeletal: Strength & Muscle Tone: within normal limits Gait & Station: normal Patient leans: N/A  Psychiatric Specialty Exam: Physical Exam  Nursing note and vitals reviewed. Constitutional: She is oriented to person, place, and time. She appears well-developed and well-nourished.  Respiratory: Effort normal.  Musculoskeletal: Normal range of motion.  Neurological: She is alert and oriented to person, place, and time.  Skin: Skin is warm.    Review of Systems  Constitutional: Negative.   HENT: Negative.   Eyes: Negative.   Respiratory: Negative.   Cardiovascular: Negative.   Gastrointestinal: Negative.   Genitourinary: Negative.   Musculoskeletal: Negative.   Skin: Negative.   Neurological: Negative.   Endo/Heme/Allergies: Negative.   Psychiatric/Behavioral: Negative.   denies headache, denies chest pain, no shortness of breath   Blood pressure 111/80, pulse 72,  temperature 98 F (36.7 C), temperature source Oral, resp. rate 16, height '5\' 1"'  (1.549 m), weight 54 kg (119 lb).Body mass index is 22.48 kg/m.  General Appearance: improving grooming   Eye Contact:  improved eye contact  Speech:  Normal Rate  Volume:  Normal  Mood:  denies feeling depressed, less labile today  Affect:  still labile and briefly tearful, but affect improved compared to yesterday  Thought Process:  Disorganized and Descriptions of Associations: Tangential- more organized today, but does remain tangential with open ended questions  Orientation:  Other:  fully alert and attentive   Thought Content:  denies hallucinations, does not appear internally preoccupied, but ruminates about feeling people's eyes are abnormal and different, and expresses delusional ideations that some people are actually zombies   Suicidal Thoughts:  No denies suicidal or self injurious  ideations, denies homicidal or violent ideations  Homicidal Thoughts:  No  Memory:  Recent and remote fair   Judgement:  Fair- improving   Insight:  Fair- improving   Psychomotor Activity:  Normal- no psychomotor agitation at this time  Concentration:  Concentration: Good and Attention Span: Good  Recall:  [place] of Knowledge:  Good  Language:  Good  Akathisia:  No  Handed:  Right  AIMS (if indicated):     Assets:  Communication Skills Desire for Improvement Financial Resources/Insurance Housing Physical Health Social Support Transportation  ADL's:  Intact  Cognition:  WNL  Sleep:  Number of Hours: 6   Assessment - patient presents partially improved to yesterday, but continues to present with psychotic symptoms and tangentiality. She endorses heavy, regular methamphetamine use prior to admission and is gaining insight into the negative impact drug abuse is having on her mental health and quality of life . Today ruminates about possible STD as well, but does not endorse GU symptoms. Requests STD workup. Tolerating Zyprexa well thus far .   Treatment Plan Summary: Treatment Plan reviewed as below today 12/27   Daily contact with patient to assess and evaluate symptoms and progress in treatment, Medication management and Plan is to:  Encourage group and milieu participation to work on coping skills and symptom reduction Encourage efforts to work on Radiographer, therapeutic and symptom reduction Treatment team working on disposition planning, patient encouraged to consider rehab setting as disposition plan if possible  Zyprexa 2.5 mgrs QAM and 5 mgrs QHS for psychosis, mood Celexa 10 mgrs QDAY for mood disorder, depression, anxiety  Ativan 0.5 mgrs Q 6 hours PRN for anxiety as needed Trazodone 50 mgrs QHS PRN for insomnia as needed  As per patient's request , will order  GC, Chlamydia, RPR, HIV, and Hep B/ Hep C .  Jenne Campus, MD 08/08/2017, 3:11 PM   Patient ID: Emily Barajas, female   DOB: 1995/11/28, 21 y.o.   MRN: [id number]

## 2017-08-08 NOTE — Progress Notes (Signed)
D  Pt came to the nurses station asking for ear plugs because her ears hurt but after questioning her it was discovered the sounds were making her ears hurt   She was upset demanding to go home said "I cant do this I cant do this"   She was tearful and breathing rapidly  A    Staff deescelated her  Medications administered and effectiveness monitored   Verbal support given    Relaxation breathing explained and patient coached to use it    Pt received ear plugs  R   Pt became calmer after she put the ear plugs in and is presently in bed resting    She is safe at this time

## 2017-08-09 LAB — HEMOGLOBIN A1C
Hgb A1c MFr Bld: 5.2 % (ref 4.8–5.6)
Mean Plasma Glucose: 102.54 mg/dL

## 2017-08-09 LAB — HIV ANTIBODY (ROUTINE TESTING W REFLEX): HIV Screen 4th Generation wRfx: NONREACTIVE

## 2017-08-09 LAB — RPR: RPR Ser Ql: NONREACTIVE

## 2017-08-09 LAB — HEPATITIS C ANTIBODY: HCV Ab: >11 s/co ratio — ABNORMAL HIGH (ref 0.0–0.9)

## 2017-08-09 LAB — LIPID PANEL
Cholesterol: 166 mg/dL (ref 0–200)
HDL: 56 mg/dL (ref >40)
LDL Cholesterol: 93 mg/dL (ref 0–99)
Total CHOL/HDL Ratio: 3 RATIO
Triglycerides: 85 mg/dL (ref <150)
VLDL: 17 mg/dL (ref 0–40)

## 2017-08-09 LAB — TSH: TSH: 3.71 u[IU]/mL (ref 0.350–4.500)

## 2017-08-09 LAB — HEPATITIS B SURFACE ANTIGEN: Hepatitis B Surface Ag: NEGATIVE

## 2017-08-09 NOTE — Progress Notes (Signed)
[facility] MD Progress Note  08/09/2017 11:00 AM Emily Barajas  MRN:  [id number]   Subjective:  Patient reports that she feels good today and slept really good last night. She denies any SI/HI/AVH and contrcats for safety. She states that her behavior yesterday was from watching television and it triggers her for some reason. "I don't know why. I guess it was just the shows I was watching."   Objective: Patient's chart and findings reviewed and discussed with treatment team. Patient presents in her bed and is cooperative and pleasant. She is alert and oriented today. Encouraged to minimize television viewing until completely stabilized. Will continue current medication regimen.   Principal Problem: Substance-induced psychotic disorder [facility]) Diagnosis:   Patient Active Problem List   Diagnosis Date Noted  . 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 Anxiety   . Asthma   . Headache   . Hepatitis   . Seizures (HCC)    Pt does not remember when last one was, states "it's been awhile"   History reviewed. No pertinent surgical history. Family History: History reviewed. No pertinent family history. Family Psychiatric  History: See H&P Social History:  Social History   Substance and Sexual Activity  Alcohol Use No  . Frequency: Never     Social History   Substance and Sexual Activity  Drug Use Yes  . Frequency: 7.0 times per week  . Types: Amphetamines, Marijuana    Social History   Socioeconomic History  . Marital status: Single    Spouse name: None  . Number of children: None  . Years of education: None  . Highest education level: None  Social Needs  . Financial resource strain: None  . Food insecurity - worry: None  . Food insecurity - inability: None  . Transportation needs - medical: None  .  Transportation needs - non-medical: None  Occupational History  . None  Tobacco Use  . Smoking status: Current Every Day Smoker    Packs/day: 1.00    Types: Cigarettes  . Smokeless tobacco: Never Used  Substance and Sexual Activity  . Alcohol use: No    Frequency: Never  . Drug use: Yes    Frequency: 7.0 times per week    Types: Amphetamines, Marijuana  . Sexual activity: Not Currently  Other Topics Concern  . None  Social History Narrative  . None   Additional Social History:    Pain Medications: denies Prescriptions: denies Over the Counter: denies History of alcohol / drug use?: Yes Longest period of sobriety (when/how long): none reported Negative Consequences of Use: Financial, Legal, Personal relationships, Work / School Name of Substance 1: methamphetamine 1 - Age of First Use: 21 1 - Amount (size/oz): <1 gram 1 - Frequency: daily 1 - Duration: 2 mos 1 - Last Use / Amount: yesterday Name of Substance 2: Marijuana 2 - Age of First Use: 17 2 - Amount (size/oz): unknown 2 - Frequency: daily 2 - Duration: 4 years 2 - Last Use / Amount: unknown                Sleep: Good  Appetite:  Good  Current Medications: Current Facility-Administered Medications  Medication Dose Route Frequency Provider Last Rate Last Dose  . acetaminophen (TYLENOL) tablet 650 mg  650 mg Oral Q6H PRN Jackelyn Poling, NP      .  alum & mag hydroxide-simeth (MAALOX/MYLANTA) 200-200-20 MG/5ML suspension 30 mL  30 mL Oral Q4H PRN Nira Conn A, NP      . citalopram (CELEXA) tablet 10 mg  10 mg Oral Daily Thresa Ross, MD   10 mg at 08/09/17 0744  . LORazepam (ATIVAN) tablet 0.5 mg  0.5 mg Oral Q6H PRN Money, Gerlene Burdock, FNP   0.5 mg at 08/08/17 2016  . magnesium hydroxide (MILK OF MAGNESIA) suspension 30 mL  30 mL Oral Daily PRN Nira Conn A, NP      . nicotine polacrilex (NICORETTE) gum 2 mg  2 mg Oral PRN Sherah Lund, Rockey Situ, MD      . OLANZapine (ZYPREXA) tablet 2.5 mg  2.5 mg Oral  BH-q7a Shadoe Bethel, Rockey Situ, MD   2.5 mg at 08/09/17 0743  . OLANZapine (ZYPREXA) tablet 5 mg  5 mg Oral QHS Aubriel Khanna, Rockey Situ, MD   5 mg at 08/08/17 2017  . OLANZapine zydis (ZYPREXA) disintegrating tablet 5 mg  5 mg Oral Q8H PRN Money, Gerlene Burdock, FNP   5 mg at 08/08/17 1448  . traZODone (DESYREL) tablet 50 mg  50 mg Oral QHS PRN Elora Wolter, Rockey Situ, MD   50 mg at 08/08/17 2019    Lab Results:  Results for orders placed or performed during the hospital encounter of 08/03/17 (from the past 48 hour(s))  Hepatitis B surface antigen     Status: None   Collection Time: 08/08/17  6:35 PM  Result Value Ref Range   Hepatitis B Surface Ag Negative Negative    Comment: (NOTE) Performed At: [facility] [address] [place], [place] [id number] Jolene Schimke MD [id number] Performed at [facility], [address], [place], [place] [address]   Hepatitis C antibody     Status: Abnormal   Collection Time: 08/08/17  6:35 PM  Result Value Ref Range   HCV Ab >11.0 (H) 0.0 - 0.9 s/co ratio    Comment: (NOTE)                                  Negative:     < 0.8                             Indeterminate: 0.8 - 0.9                                  Positive:     > 0.9 The CDC recommends that a positive HCV antibody result be followed up with a HCV Nucleic Acid Amplification test (132440). Performed At: [facility] [address] [place], [place] [id number] Jolene Schimke MD [id number] Performed at [facility], [address] [place], [place] [address]   Hemoglobin A1c     Status: None   Collection Time: 08/09/17  6:36 AM  Result Value Ref Range   Hgb A1c MFr Bld 5.2 4.8 - 5.6 %    Comment: (NOTE) Pre diabetes:          5.7%-6.4% Diabetes:              >6.4% Glycemic control for   <7.0% adults with diabetes    Mean Plasma Glucose 102.54 mg/dL    Comment: Performed at [facility], [address], [place], [place] [address]  Lipid panel     Status: None   Collection Time: 08/09/17  6:36 AM  Result Value Ref Range   Cholesterol 166 0 - 200 mg/dL   Triglycerides 85 <161<150 mg/dL   HDL 56 >09>40 mg/dL   Total CHOL/HDL Ratio 3.0 RATIO   VLDL 17 0 - 40 mg/dL   LDL Cholesterol 93 0 - 99 mg/dL    Comment:        Total Cholesterol/HDL:CHD Risk Coronary Heart Disease Risk Table                     Men   Women  1/2 Average Risk   3.4   3.3  Average Risk       5.0   4.4  2 X Average Risk   9.6   7.1  3 X Average Risk  23.4   11.0        Use the calculated Patient Ratio above and the CHD Risk Table to determine the patient's CHD Risk.        ATP III CLASSIFICATION (LDL):  <100     mg/dL   Optimal  [id number]  mg/dL   Near or Above                    Optimal  130-159  mg/dL   Borderline  [id number]  mg/dL   High  >478>190     mg/dL   Very High Performed at [facility], [address] [place], [place] [address]   TSH     Status: None   Collection Time: 08/09/17  6:36 AM  Result Value Ref Range   TSH 3.710 0.350 - 4.500 uIU/mL    Comment: Performed by a 3rd Generation assay with a functional sensitivity of <=0.01 uIU/mL. Performed at [facility], [address] [place], [place] [address]     Blood Alcohol level:  No results found for: [facility]  Metabolic Disorder Labs: Lab Results  Component Value Date   HGBA1C 5.2 08/09/2017   MPG 102.54 08/09/2017   No results found for: PROLACTIN Lab Results  Component Value Date   CHOL 166 08/09/2017   TRIG 85 08/09/2017   HDL 56 08/09/2017   CHOLHDL 3.0 08/09/2017   VLDL 17 08/09/2017   LDLCALC 93 08/09/2017   LDLCALC 93 08/05/2017    Physical Findings: AIMS: Facial and Oral Movements Muscles of Facial Expression: Mild Lips and Perioral Area: Mild Jaw: Mild Tongue: Mild,Extremity Movements Upper (arms, wrists, hands, fingers): None, normal Lower (legs, knees, ankles, toes): None, normal, Trunk Movements Neck, shoulders,  hips: None, normal, Overall Severity Severity of abnormal movements (highest score from questions above): Mild Incapacitation due to abnormal movements: None, normal Patient's awareness of abnormal movements (rate only patient's report): Aware, mild distress, Dental Status Current problems with teeth and/or dentures?: No Does patient usually wear dentures?: No  CIWA:  CIWA-Ar Total: 4 COWS:     Musculoskeletal: Strength & Muscle Tone: within normal limits Gait & Station: normal Patient leans: N/A  Psychiatric Specialty Exam: Physical Exam  Nursing note and vitals reviewed. Constitutional: She is oriented to person, place, and time. She appears well-developed and well-nourished.  Cardiovascular: Normal rate.  Respiratory: Effort normal.  Musculoskeletal: Normal range of motion.  Neurological: She is alert and oriented to person, place, and time.  Skin: Skin is warm.    Review of Systems  Constitutional: Negative.   HENT: Negative.   Eyes: Negative.  Respiratory: Negative.   Cardiovascular: Negative.   Gastrointestinal: Negative.   Genitourinary: Negative.   Musculoskeletal: Negative.   Skin: Negative.   Neurological: Negative.   Endo/Heme/Allergies: Negative.   Psychiatric/Behavioral: Positive for depression (minor ). Negative for hallucinations and suicidal ideas.    Blood pressure 115/70, pulse 70, temperature (!) 97.3 F (36.3 C), temperature source Oral, resp. rate 16, height 5\' 1"  (1.549 m), weight 54 kg (119 lb).Body mass index is 22.48 kg/m.  General Appearance: Casual  Eye Contact:  Good  Speech:  Clear and Coherent and Normal Rate  Volume:  Normal  Mood:  Depressed  Affect:  Flat  Thought Process:  Goal Directed and Descriptions of Associations: Intact  Orientation:  Full (Time, Place, and Person)  Thought Content:  WDL  Suicidal Thoughts:  No  Homicidal Thoughts:  No  Memory:  Immediate;   Good Recent;   Good Remote;   Good  Judgement:  Fair  Insight:   Fair  Psychomotor Activity:  Normal  Concentration:  Concentration: Good and Attention Span: Good  Recall:  Good  Fund of Knowledge:  Good  Language:  Good  Akathisia:  No  Handed:  Right  AIMS (if indicated):     Assets:  Communication Skills Desire for Improvement Financial Resources/Insurance Physical Health Social Support  ADL's:  Intact  Cognition:  WNL  Sleep:  Number of Hours: 6.25   Problems Addressed: Substance induced psychotic disorder  Treatment Plan Summary: Daily contact with patient to assess and evaluate symptoms and progress in treatment, Medication management and Plan is to:  -Continue Celexa 10 mg PO Daily for mood stability -Continue Zyprexa 2.5 mg PO Daily and 5 mg PO QHS for mood stability -Continue Trazodone 50 mg PO QHS PRN for insomnia -Continue Ativan 0.6 mg PO Q6H PRN for anxiety or sedation -Encourage limited television viewing -Encourage group therapy participation  Maryfrances Bunnellravis B Money, FNP 08/09/2017, 11:00 AM   Agree with NP Progress Note

## 2017-08-09 NOTE — Progress Notes (Signed)
Pt in hall outside dayroom crying and hands are shaking.  Pt sts her anxiety is very high and "can I have something to calm me down"? Pt given PRN medication, PO Pt in dayroom ready for group session

## 2017-08-09 NOTE — Progress Notes (Signed)
The patient attended the evening A.A. Meeting and was appropriate.  

## 2017-08-09 NOTE — Progress Notes (Signed)
D: Pt presents with an animated affect and an anxious mood. Pt continues to make weird gestures and bizarre posturing throughout the day. Pt thoughts are disorganized and pt is noted to make bizarre comments. Pt denies AVH but often appears to be responding to internal stimuli. On approach pt noted to be wide eyed with an intense stare. Pt noted to be talking to herself while walking the hallway. Pt is childlike and often requires redirecting by staff. A: Medications reviewed with pt. Medications administered as ordered per MD. Verbal support provided. Pt encouraged to attend groups. 15 minute checks performed for safety. R: Pt compliant with tx.

## 2017-08-09 NOTE — Progress Notes (Signed)
Recreation Therapy Notes  Date: 08/09/17 Time: 0930 Location: [address] Dayroom  Group Topic: Stress Management  Goal Area(s) Addresses:  Patient will verbalize importance of using healthy stress management.  Patient will identify positive emotions associated with healthy stress management.   Intervention: Stress Management  Activity :  Body Scan Meditation.  LRT introduced the stress management technique of meditation.  LRT played Barajas meditation from the Cal[employer] app that guided them through Barajas body scan to allow them to become aware of any sensations, tensions or uneasy feelings they may be experiencing.  Education:  Stress Management, Discharge Planning.   Education Outcome: Acknowledges edcuation/In group clarification offered/Needs additional education  Clinical Observations/Feedback: Pt did not attend group.    Emily Barajas, LRT/CTRS         Emily Barajas 08/09/2017 10:59 AM 

## 2017-08-10 MED ORDER — CITALOPRAM HYDROBROMIDE 20 MG PO TABS
20.0000 mg | ORAL_TABLET | Freq: Every day | ORAL | Status: DC
  Administered 2017-08-11 – 2017-08-12 (×2): 20 mg via ORAL
  Filled 2017-08-10 (×4): qty 1

## 2017-08-10 NOTE — Progress Notes (Signed)
[facility]  08/10/2017 11:10 AM Emily FewVirginia Barajas  MRN:  [id number]   Subjective:  Emily Barajas reports " I am feeling better today, it's the anniversary of my mother's death and I was crying a lot"  Objective: Emily Barajas Barajas is awake, alert and oriented. Seen standing at the nursing station. reports taken medication as prescribed and tolerating medications well.   Denies suicidal or homicidal ideation. Denies auditory or visual hallucination and does not appear to be responding to internal stimuli.  Per staffing notes patient presents with bazaar, high anxiety and disorganized behavior.  Patient reports " I am not long having anxiety, just ready to go home to be with my sister." Patient reports she is medication compliant without mediation side effects. Reports good appetite and reports resting well. Support, encouragement and reassurance was provided.    Principal Problem: Substance-induced psychotic disorder Emily Barajas(HCC) Diagnosis:   Patient Active Problem List   Diagnosis Date Noted  . Substance-induced psychotic disorder [place] Barajas(HCC) [F19.959] 08/03/2017   Total Time spent with patient: 15 minutes  Past Psychiatric History: See H&P  Past Medical History:  Past Medical History:  Diagnosis Date  . Anxiety   . Asthma   . Headache   . Hepatitis   . Seizures (HCC)    Pt does not remember when last one was, states "it's been awhile"   History reviewed. No pertinent surgical history. Family History: History reviewed. No pertinent family history. Family Psychiatric  History: See H&P Social History:  Social History   Substance and Sexual Activity  Alcohol Use No  . Frequency: Never     Social History   Substance and Sexual Activity  Drug Use Yes  . Frequency: 7.0 times per week  . Types: Amphetamines, Marijuana    Social History   Socioeconomic History  . Marital status: Single    Spouse name: None  . Number of children: None  . Years of education: None  . Highest  education level: None  Social Needs  . Financial resource strain: None  . Food insecurity - worry: None  . Food insecurity - inability: None  . Transportation needs - medical: None  . Transportation needs - non-medical: None  Occupational History  . None  Tobacco Use  . Smoking status: Current Every Day Smoker    Packs/day: 1.00    Types: Cigarettes  . Smokeless tobacco: Never Used  Substance and Sexual Activity  . Alcohol use: No    Frequency: Never  . Drug use: Yes    Frequency: 7.0 times per week    Types: Amphetamines, Marijuana  . Sexual activity: Not Currently  Other Topics Concern  . None  Social History Narrative  . None   Additional Social History:    Pain Medications: denies Prescriptions: denies Over the Counter: denies History of alcohol / drug use?: Yes Longest period of sobriety (when/how long): none reported Negative Consequences of Use: Financial, Legal, Personal relationships, Work / School Name of Substance 1: methamphetamine 1 - Age of First Use: 21 1 - Amount (size/oz): <1 gram 1 - Frequency: daily 1 - Duration: 2 mos 1 - Last Use / Amount: yesterday Name of Substance 2: Marijuana 2 - Age of First Use: 17 2 - Amount (size/oz): unknown 2 - Frequency: daily 2 - Duration: 4 years 2 - Last Use / Amount: unknown                Sleep: Good  Appetite:  Good  Current Medications: Current Facility-Administered Medications  Medication Dose Route Frequency Provider Last Rate Last Dose  . acetaminophen (TYLENOL) tablet 650 mg  650 mg Oral Q6H PRN Nira Conn A, NP      . alum & mag hydroxide-simeth (MAALOX/MYLANTA) 200-200-20 MG/5ML suspension 30 mL  30 mL Oral Q4H PRN Nira Conn A, NP      . citalopram (CELEXA) tablet 10 mg  10 mg Oral Daily Thresa Ross, MD   10 mg at 08/10/17 0931  . LORazepam (ATIVAN) tablet 0.5 mg  0.5 mg Oral Q6H PRN Money, Gerlene Burdock, FNP   0.5 mg at 08/09/17 1933  . magnesium hydroxide (MILK OF MAGNESIA) suspension  30 mL  30 mL Oral Daily PRN Nira Conn A, NP      . nicotine polacrilex (NICORETTE) gum 2 mg  2 mg Oral PRN Keshara Kiger, Rockey Situ, MD      . OLANZapine (ZYPREXA) tablet 2.5 mg  2.5 mg Oral BH-q7a [place] Sylvan, Rockey Situ, MD   2.5 mg at 08/10/17 1610  . OLANZapine (ZYPREXA) tablet 5 mg  5 mg Oral QHS Alanzo Lamb, Rockey Situ, MD   5 mg at 08/09/17 2131  . OLANZapine zydis (ZYPREXA) disintegrating tablet 5 mg  5 mg Oral Q8H PRN Money, Gerlene Burdock, FNP   5 mg at 08/09/17 2209  . traZODone (DESYREL) tablet 50 mg  50 mg Oral QHS PRN Karianna Gusman, Rockey Situ, MD   50 mg at 08/09/17 2131    Lab Results:  Results for orders placed or performed during the Barajas encounter of 08/03/17 (from the past 48 hour(s))  Hepatitis B surface antigen     Status: None   Collection Time: 08/08/17  6:35 PM  Result Value Ref Range   Hepatitis B Surface Ag Negative Negative    Comment: (NOTE) Performed At: [facility] [address] [place], [place] [id number] Jolene Schimke MD [id number] Performed at [facility], [address], [place], [place] [address]   Hepatitis C antibody     Status: Abnormal   Collection Time: 08/08/17  6:35 PM  Result Value Ref Range   HCV Ab >11.0 (H) 0.0 - 0.9 s/co ratio    Comment: (NOTE)                                  Negative:     < 0.8                             Indeterminate: 0.8 - 0.9                                  Positive:     > 0.9 The CDC recommends that a positive HCV antibody result be followed up with a HCV Nucleic Acid Amplification test (578469). Performed At: [facility] [address] [place], [place] [id number] Jolene Schimke MD [id number] Performed at [facility], [address] [place], [place] [address]   RPR     Status: None   Collection Time: 08/09/17  6:36 AM  Result Value Ref Range   RPR Ser Ql Non Reactive Non Reactive    Comment: (NOTE) Performed At: [facility] [address]  [place], [place] [id number] Jolene Schimke MD [id number] Performed at [facility], [address] [place], [place] [address]   HIV  antibody     Status: None   Collection Time: 08/09/17  6:36 AM  Result Value Ref Range   HIV Screen 4th Generation wRfx Non Reactive Non Reactive    Comment: (NOTE) Performed At: [facility] [address] [place], [place] [id number] Jolene Schimke MD [id number] Performed at [facility], [address], [place], [place] [address]   Hemoglobin A1c     Status: None   Collection Time: 08/09/17  6:36 AM  Result Value Ref Range   Hgb A1c MFr Bld 5.2 4.8 - 5.6 %    Comment: (NOTE) Pre diabetes:          5.7%-6.4% Diabetes:              >6.4% Glycemic control for   <7.0% adults with diabetes    Mean Plasma Glucose 102.54 mg/dL    Comment: Performed at [facility], [address], [place], [place] [address]  Lipid panel     Status: None   Collection Time: 08/09/17  6:36 AM  Result Value Ref Range   Cholesterol 166 0 - 200 mg/dL   Triglycerides 85 <784 mg/dL   HDL 56 >69 mg/dL   Total CHOL/HDL Ratio 3.0 RATIO   VLDL 17 0 - 40 mg/dL   LDL Cholesterol 93 0 - 99 mg/dL    Comment:        Total Cholesterol/HDL:CHD Risk Coronary Heart Disease Risk Table                     Men   Women  1/2 Average Risk   3.4   3.3  Average Risk       5.0   4.4  2 X Average Risk   9.6   7.1  3 X Average Risk  23.4   11.0        Use the calculated Patient Ratio above and the CHD Risk Table to determine the patient's CHD Risk.        ATP III CLASSIFICATION (LDL):  <100     mg/dL   Optimal  629-528  mg/dL   Near or Above                    Optimal  130-159  mg/dL   Borderline  413-244  mg/dL   High  >010     mg/dL   Very High Performed at [facility], [address], [place], [place] [address]   TSH     Status: None   Collection Time: 08/09/17  6:36 AM  Result Value Ref Range   TSH  3.710 0.350 - 4.500 uIU/mL    Comment: Performed by a 3rd Generation assay with a functional sensitivity of <=0.01 uIU/mL. Performed at [facility], [address] [place], [place] [address]     Blood Alcohol level:  No results found for: [facility]  Metabolic Disorder Labs: Lab Results  Component Value Date   HGBA1C 5.2 08/09/2017   MPG 102.54 08/09/2017   No results found for: PROLACTIN Lab Results  Component Value Date   CHOL 166 08/09/2017   TRIG 85 08/09/2017   HDL 56 08/09/2017   CHOLHDL 3.0 08/09/2017   VLDL 17 08/09/2017   LDLCALC 93 08/09/2017   LDLCALC 93 08/05/2017    Physical Findings: AIMS: Facial and Oral Movements Muscles of Facial Expression: Mild Lips and Perioral Area: Mild Jaw: Mild Tongue: Mild,Extremity Movements Upper (arms, wrists, hands,  fingers): None, normal Lower (legs, knees, ankles, toes): None, normal, Trunk Movements Neck, shoulders, hips: None, normal, Overall Severity Severity of abnormal movements (highest score from questions above): Mild Incapacitation due to abnormal movements: None, normal Patient's awareness of abnormal movements (rate only patient's report): Aware, mild distress, Dental Status Current problems with teeth and/or dentures?: No Does patient usually wear dentures?: No  CIWA:  CIWA-Ar Total: 4 COWS:     Musculoskeletal: Strength & Muscle Tone: within normal limits Gait & Station: normal Patient leans: N/A  Psychiatric Specialty Exam: Physical Exam  Nursing note and vitals reviewed. Constitutional: She is oriented to person, place, and time. She appears well-developed and well-nourished.  Cardiovascular: Normal rate.  Respiratory: Effort normal.  Musculoskeletal: Normal range of motion.  Neurological: She is alert and oriented to person, place, and time.  Skin: Skin is warm.  Psychiatric: She has a normal mood and affect.    Review of Systems  Psychiatric/Behavioral: Positive for depression  (minor ). Negative for hallucinations and suicidal ideas.  All other systems reviewed and are negative.   Blood pressure 111/68, pulse 93, temperature 98.6 F (37 C), temperature source Oral, resp. rate 16, height 5\' 1"  (1.549 m), weight 54 kg (119 lb).Body mass index is 22.48 kg/m.  General Appearance: Casual- paper scrubs   Eye Contact:  Good  Speech:  Clear and Coherent and Normal Rate  Volume:  Normal  Mood:  Depressed  Affect:  Flat  Thought Process:  Goal Directed and Descriptions of Associations: Intact  Orientation:  Full (Time, Place, and Person)  Thought Content:  WDL  Suicidal Thoughts:  No  Homicidal Thoughts:  No  Memory:  Immediate;   Good Recent;   Good Remote;   Good  Judgement:  Fair  Insight:  Fair  Psychomotor Activity:  Normal  Concentration:  Concentration: Good and Attention Span: Good  Recall:  Good  Fund of Knowledge:  Good  Language:  Good  Akathisia:  No  Handed:  Right  AIMS (if indicated):     Assets:  Communication Skills Desire for Improvement Financial Resources/Insurance Physical Health Social Support  ADL's:  Intact  Cognition:  WNL  Sleep:  Number of Hours: 5.75   Problems Addressed: Substance induced psychotic disorder  Treatment Plan Summary: Daily contact with patient to assess and evaluate symptoms and progress in treatment and Medication management   -Increased  Celexa 10 mg to 20 mg  PO Daily for mood stability -Continue Zyprexa 2.5 mg PO Daily and 5 mg PO QHS for mood stability -Continue Trazodone 50 mg PO QHS PRN for insomnia -Continue Ativan 0.6 mg PO Q6H PRN for anxiety or sedation  -Encourage limited television viewing -Encourage group therapy participation  Oneta Rackanika N Lewis, NP 08/10/2017, 11:10 AM   Agree with NP Progress Note

## 2017-08-10 NOTE — Progress Notes (Signed)
Pt still presenting with bizarre behaviors, but seems somewhat more appropriate this evening.  She came to Clinical research associatewriter stating that her mother had passed away a few months age, but she was handling it ok.  She said her mother was in heaven and that she was able to breathe as her mother had died of complications related to COPD.  She denies SI/HI/AVH at this time.  She spends most of her time in the dayroom talking with her female peers who are saying inappropriate things and require redirection from staff.  Pt has been compliant with her meds tonight.  She did require a prn Ativan at the beginning of the shift as she stated she was feeling anxious, but almost immediately she calmed down after taking the pill.  She also had a prn zydis at bedtime as she was still anxious and not sleepy after taking the other prns.  Support and encouragement offered.  Discharge plans are in process.  [facility]  Safety maintained with q15 minute checks.

## 2017-08-10 NOTE — Progress Notes (Signed)
D Patient is seen stnading at the nurses' station this am. [facility], her fingernails have dirt in them, her hair is mussed up and she is sleepy and anxious. She says " do you know if they found me some place to live yet? I haven't heard anything na di was wondering where I'm gonna go"... A Pt takes her scheduled meds as ordered. She completed her daily assessment and on this she wrote she denied SI today and she rated her depression, hopelessness and anxiety " none/ none/ 0", respectively. R Safety in place. SW asked to 1;1 with -pt regarding her discharge planning.

## 2017-08-10 NOTE — Progress Notes (Signed)
D.  Pt appears paranoid and anxious on approach.  Pt appears to have disjointed thoughts at times.  Pt came up after group in a panicked state to report that the TV was talking to her.  Pt appeared upset and frightened.  Peers were observed to be laughing and watching patient.  Pt took medication without issue and briefly went to her room.  She came back out almost immediately and returned to dayroom but chose a seat in the back no longer facing TV.  Pt denies SI/HI at this time.  A.  Support and encouragement offered, spoke with NP and order received to go ahead and give the prn Zyprexa dose with the scheduled.  R.  Pt calm at this time setting in dayroom.  Will continue to monitor closely.

## 2017-08-11 NOTE — Progress Notes (Signed)
Pt's sister called and stated to secretary that she is concerned about Pt.  Pt called her and stated that person named Fayrene FearingJames would put her up in a hotel for a week for favors at discharge.  Sister wanted to know if Fayrene FearingJames was a member of the staff.  Secretary informed sister that there is not a staff member by that name and sister expressed relief to know that as she did not know if Pt was delusional about this or not.  There is a peer on the hall named Fayrene FearingJames so will inform his nurse of conversation with sister.

## 2017-08-11 NOTE — Plan of Care (Signed)
Pt has been attending groups and is medication compliant.  She does continue to have periodic meltdowns but will come to staff at those times.  She has remained safe on the unit and not engaged in self harm.

## 2017-08-11 NOTE — Progress Notes (Signed)
D. Pt was positive for evening AA group, observed in dayroom interacting appropriately at this time.  Pt denies SI/HI/AVH at this time.  A.  Support and encouragement offered, medication given as ordered.  R.  Pt remains safe on the unit, will continue to monitor.

## 2017-08-11 NOTE — [facility] Suicide Risk Assessment (Signed)
[facility] INPATIENT:  Family/Significant Other Suicide Prevention Education  Suicide Prevention Education:  Contact Attempts: [name], brother, [phone] , (name of family member/significant other) has been identified by the patient as the family member/significant other with whom the patient will be residing, and identified as the person(s) who will aid the patient in the event of a mental health crisis.  With written consent from the patient, two attempts were made to provide suicide prevention education, prior to and/or following the patient's discharge.  We were unsuccessful in providing suicide prevention education.  A suicide education pamphlet was given to the patient to share with family/significant other.  Date and time of first attempt:08/11/17 @ 1619  Emily Barajas 08/11/2017, 4:18 PM

## 2017-08-11 NOTE — [facility] Group Notes (Signed)
[facility]  Date:  08/11/2017  Time:  1300  Type of Therapy:  Nurse Education  Participation Level:  Active  Participation Quality:  Appropriate  Affect:  Angry  Cognitive:  intact  Insight:  Good  Engagement in Group:  Distracting  Modes of Intervention:  Education  Summary of Progress/Problems:  Emily Barajas, Emily Barajas Lynn 08/11/2017, 2:33 PM

## 2017-08-11 NOTE — [facility] Group Notes (Signed)
[facility] LCSW Group Therapy Note  Date/Time 08/11/17 10:00 AM  Type of Therapy and Topic:  Group Therapy:  Cognitive Distortions  Participation Level:  Did not attend  Klay Sobotka J Yehoshua Vitelli MSW, LCSW 

## 2017-08-11 NOTE — Progress Notes (Signed)
[facility]  08/11/2017 9:12 AM Emily FewVirginia Barajas  MRN:  [id number]    Objective: Emily Barajas  seen standing at the nursing station requesting cereal. Discussed medication adjustment to Celexa patient is agreeable to treatment plan. Patient continues to deny suicidal or homicidal ideation during this assessment. Denies auditory or visual hallucination and does not appear to be responding to internal stimuli. Per staffing notes patient continues paranoia and disorganized thoughts throughout the day. Patient continues to request to be discharged to sister.  Reports good appetite and reports resting well. Support, encouragement and reassurance was provided.    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 Anxiety   . Asthma   . Headache   . Hepatitis   . Seizures (HCC)    Pt does not remember when last one was, states "it's been awhile"   History reviewed. No pertinent surgical history. Family History: History reviewed. No pertinent family history. Family Psychiatric  History: See H&P Social History:  Social History   Substance and Sexual Activity  Alcohol Use No  . Frequency: Never     Social History   Substance and Sexual Activity  Drug Use Yes  . Frequency: 7.0 times per week  . Types: Amphetamines, Marijuana    Social History   Socioeconomic History  . Marital status: Single    Spouse name: None  . Number of children: None  . Years of education: None  . Highest education level: None  Social Needs  . Financial resource strain: None  . Food insecurity - worry: None  . Food insecurity - inability: None  . Transportation needs - medical: None  . Transportation  needs - non-medical: None  Occupational History  . None  Tobacco Use  . Smoking status: Current Every Day Smoker    Packs/day: 1.00    Types: Cigarettes  . Smokeless tobacco: Never Used  Substance and Sexual Activity  . Alcohol use: No    Frequency: Never  . Drug use: Yes    Frequency: 7.0 times per week    Types: Amphetamines, Marijuana  . Sexual activity: Not Currently  Other Topics Concern  . None  Social History Narrative  . None   Additional Social History:    Pain Medications: denies Prescriptions: denies Over the Counter: denies History of alcohol / drug use?: Yes Longest period of sobriety (when/how long): none reported Negative Consequences of Use: Financial, Legal, Personal relationships, Work / School Name of Substance 1: methamphetamine 1 - Age of First Use: 21 1 - Amount (size/oz): <1 gram 1 - Frequency: daily 1 - Duration: 2 mos 1 - Last Use / Amount: yesterday Name of Substance 2: Marijuana 2 - Age of First Use: 17 2 - Amount (size/oz): unknown 2 - Frequency: daily 2 - Duration: 4 years 2 - Last Use / Amount: unknown                Sleep: Good  Appetite:  Good  Current Medications: Current Facility-Administered Medications  Medication Dose Route Frequency Provider Last Rate Last Dose  . acetaminophen (TYLENOL) tablet 650 mg  650 mg Oral Q6H PRN Jackelyn PolingBerry, Jason A, NP      . alum & mag hydroxide-simeth (MAALOX/MYLANTA) 200-200-20 MG/5ML  suspension 30 mL  30 mL Oral Q4H PRN Nira Conn A, NP      . citalopram (CELEXA) tablet 20 mg  20 mg Oral Daily Oneta Rack, NP      . LORazepam (ATIVAN) tablet 0.5 mg  0.5 mg Oral Q6H PRN Money, Gerlene Burdock, FNP   0.5 mg at 08/10/17 2142  . magnesium hydroxide (MILK OF MAGNESIA) suspension 30 mL  30 mL Oral Daily PRN Nira Conn A, NP      . nicotine polacrilex (NICORETTE) gum 2 mg  2 mg Oral PRN Cobos, Rockey Situ, MD      . OLANZapine (ZYPREXA) tablet 2.5 mg  2.5 mg Oral BH-q7a Cobos, Rockey Situ, MD   2.5 mg  at 08/10/17 1610  . OLANZapine (ZYPREXA) tablet 5 mg  5 mg Oral QHS Cobos, Rockey Situ, MD   5 mg at 08/10/17 2142  . OLANZapine zydis (ZYPREXA) disintegrating tablet 5 mg  5 mg Oral Q8H PRN Money, Gerlene Burdock, FNP   5 mg at 08/10/17 2156  . traZODone (DESYREL) tablet 50 mg  50 mg Oral QHS PRN Cobos, Rockey Situ, MD   50 mg at 08/10/17 2142    Lab Results:  No results found for this or any previous visit (from the past 48 hour(s)).  Blood Alcohol level:  No results found for: [facility]  Metabolic Disorder Labs: Lab Results  Component Value Date   HGBA1C 5.2 08/09/2017   MPG 102.54 08/09/2017   No results found for: PROLACTIN Lab Results  Component Value Date   CHOL 166 08/09/2017   TRIG 85 08/09/2017   HDL 56 08/09/2017   CHOLHDL 3.0 08/09/2017   VLDL 17 08/09/2017   LDLCALC 93 08/09/2017   LDLCALC 93 08/05/2017    Physical Findings: AIMS: Facial and Oral Movements Muscles of Facial Expression: Mild Lips and Perioral Area: Mild Jaw: Mild Tongue: Mild,Extremity Movements Upper (arms, wrists, hands, fingers): None, normal Lower (legs, knees, ankles, toes): None, normal, Trunk Movements Neck, shoulders, hips: None, normal, Overall Severity Severity of abnormal movements (highest score from questions above): Mild Incapacitation due to abnormal movements: None, normal Patient's awareness of abnormal movements (rate only patient's report): Aware, mild distress, Dental Status Current problems with teeth and/or dentures?: No Does patient usually wear dentures?: No  CIWA:  CIWA-Ar Total: 4 COWS:     Musculoskeletal: Strength & Muscle Tone: within normal limits Gait & Station: normal Patient leans: N/A  Psychiatric Specialty Exam: Physical Exam  Nursing note and vitals reviewed. Constitutional: She is oriented to person, place, and time. She appears well-developed and well-nourished.  Cardiovascular: Normal rate.  Respiratory: Effort normal.  Musculoskeletal: Normal range of  motion.  Neurological: She is alert and oriented to person, place, and time.  Skin: Skin is warm.  Psychiatric: She has a normal mood and affect.    Review of Systems  Psychiatric/Behavioral: Positive for depression (minor ). Negative for hallucinations and suicidal ideas.    Blood pressure 127/80, pulse 98, temperature 98 F (36.7 C), temperature source Oral, resp. rate 16, height 5\' 1"  (1.549 m), weight 54 kg (119 lb).Body mass index is 22.48 kg/m.  General Appearance: Casual- paper scrubs   Eye Contact:  Good  Speech:  Clear and Coherent and Normal Rate  Volume:  Normal  Mood:  Depressed  Affect:  Flat  Thought Process:  Goal Directed and Descriptions of Associations: Intact  Orientation:  Full (Time, Place, and Person)  Thought Content:  WDL  Suicidal Thoughts:  No  Homicidal Thoughts:  No  Memory:  Immediate;   Good Recent;   Good Remote;   Good  Judgement:  Fair  Insight:  Fair  Psychomotor Activity:  Normal  Concentration:  Concentration: Good and Attention Span: Good  Recall:  Good  Fund of Knowledge:  Good  Language:  Good  Akathisia:  No  Handed:  Right  AIMS (if indicated):     Assets:  Communication Skills Desire for Improvement Financial Resources/Insurance Physical Health Social Support  ADL's:  Intact  Cognition:  WNL  Sleep:  Number of Hours: 6   Problems Addressed: Substance induced psychotic disorder  Treatment Plan Summary: Daily contact with patient to assess and evaluate symptoms and progress in treatment and Medication management   Continue with current treatment plan on 08/11/2017 except where noted  -Continue Celexa  20 mg  PO Daily for mood stability -Continue Zyprexa 2.5 mg PO Daily and 5 mg PO QHS for mood stability -Continue Trazodone 50 mg PO QHS PRN for insomnia -Continue Ativan 0.6 mg PO Q6H PRN for anxiety or sedation  -Encourage limited television viewing -Encourage group therapy participation  Oneta Rackanika N Lewis,  NP 08/11/2017, 9:12 AM   Agree with NP Progress Note

## 2017-08-12 LAB — GC/CHLAMYDIA PROBE AMP (~~LOC~~) NOT AT ARMC
Chlamydia: NEGATIVE
Neisseria Gonorrhea: POSITIVE — AB

## 2017-08-12 MED ORDER — CITALOPRAM HYDROBROMIDE 20 MG PO TABS: 20.0000 mg | ORAL_TABLET | Freq: Every day | ORAL | 0 refills | Status: DC

## 2017-08-12 MED ORDER — TRAZODONE HCL 50 MG PO TABS: 50.0000 mg | ORAL_TABLET | Freq: Every evening | ORAL | 0 refills | Status: DC | PRN

## 2017-08-12 MED ORDER — OLANZAPINE 2.5 MG PO TABS: 2.5000 mg | ORAL_TABLET | ORAL | 0 refills | Status: DC

## 2017-08-12 MED ORDER — NICOTINE POLACRILEX 2 MG MT GUM: 2.0000 mg | CHEWING_GUM | OROMUCOSAL | 0 refills | Status: DC | PRN

## 2017-08-12 NOTE — Progress Notes (Signed)
D:  Patient's self inventory sheet, patient sleeps good, sleep medication helpful.  Good appetite, high energy level, good concentration.  Denied depression, hopeless and anxiety.  Denied withdrawals.  Denied SI  Denied physical problems.  Denied physical pain.  Goal is getting discharged.  Knowing her plan.  Plans to talk to MD.  Does have discharge plans.  Wants to keep picture, look happy, as a reminder. A:  Medications administered per MD orders.  Emotional support and encouragement given patient. R:  Denied SI and HI, contracts for safety.  Denied A/V hallucinations.  Safety maintained with 15 minute checks. Wants discharge today.

## 2017-08-12 NOTE — [facility] Suicide Risk Assessment (Signed)
[facility] INPATIENT:  Family/Significant Other Suicide Prevention Education  Suicide Prevention Education:  Contact Attempts: Maryella Shiversina Skeron (pt's sister) [phone] has been identified by the patient as the family member/significant other with whom the patient will be residing, and identified as the person(s) who will aid the patient in the event of a mental health crisis.  With written consent from the patient, two attempts were made to provide suicide prevention education, prior to and/or following the patient's discharge.  We were unsuccessful in providing suicide prevention education.  A suicide education pamphlet was given to the patient to share with family/significant other.  Date and time of first attempt: 9:20AM on 08/12/17 (unable to leave voicemail. Mailbox full) Date and time of second attempt: 10:18AM on 08/12/17  Ledell PeoplesHeather N Smart LCSW 08/12/2017, 10:18 AM

## 2017-08-12 NOTE — Discharge Summary (Signed)
Physician Discharge Summary Note  Patient:  Emily FewVirginia Stidham is an 21 y.o., female MRN:  [id number] DOB:  [birth year] Patient phone:  [phone] (home)  Patient address:   [place]eless [place] [id number],  Total Time spent with patient: 20 minutes  Date of Admission:  08/03/2017 Date of Discharge: 08/12/2017  Reason for Admission: Per HPI-  Caucasian female, single, no kids, unemployed, lives with her family. Background history of SUD. Presented to the unit via MCT. Reported to have been acting in a bizarre manner. Reported having tactile and auditory hallucination. Intoxicated with methamphetamine.   At interview, patient reports history of methamphetamine use for the past three years. Says she was just having a bad trip. She was hearing a lot of noises. She could not make out what is being said. She was not getting any specific instructions. She was having a lot of irritability as it felt as if ants were crawling all over her body. Says she was seeing "the city". Patient denies any suicidal thoughts. She did not have any thoughts to harm other. Says she was given some shot at the hospital. Says she feels marginally better. The hallucinations are less intense. No persecutory delusion. Not expressing any other form of delusion. Patient denies use of alcohol or any other substance. She denies any access to weapons. 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 Anxiety   . Asthma   . Headache   . Hepatitis   . Seizures (HCC)    Pt does not remember when last one was, states "it's been awhile"   History reviewed. No pertinent surgical history. Family History: History reviewed. No pertinent family history. Family  Psychiatric  History:  Social History:  Social History   Substance and Sexual Activity  Alcohol Use No  . Frequency: Never     Social History   Substance and Sexual Activity  Drug Use Yes  . Frequency: 7.0 times per week  . Types: Amphetamines, Marijuana    Social History   Socioeconomic History  . Marital status: Single    Spouse name: None  . Number of children: None  . Years of education: None  . Highest education level: None  Social Needs  . Financial resource strain: None  . Food insecurity - worry: None  . Food insecurity - inability: None  . Transportation needs - medical: None  . Transportation needs - non-medical: None  Occupational History  . None  Tobacco Use  . Smoking status: Current Every Day Smoker    Packs/day: 1.00    Types: Cigarettes  . Smokeless tobacco: Never Used  Substance and Sexual Activity  . Alcohol use: No    Frequency: Never  . Drug use: Yes    Frequency: 7.0 times per week    Types: Amphetamines, Marijuana  . Sexual activity: Not Currently  Other Topics Concern  . None  Social History Narrative  . 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  Pt was treated discharged with the medications listed below under Medication List.  Medical problems were identified and treated as needed.  Home medications were restarted as appropriate.  Improvement was monitored by observation and Emily Barajas 's daily report  of symptom reduction.  Emotional and mental status was monitored by daily self-inventory reports completed by Emily Barajas and clinical staff.         Emily Barajas was evaluated by the treatment team for stability and plans for continued recovery upon discharge. Emily Barajas 's motivation was an integral factor for scheduling further treatment. Employment, transportation, bed availability, health status, family support, and any pending legal  issues were also considered during hospital stay. Pt was offered further treatment options upon discharge including but not limited to Residential, Intensive Outpatient, and Outpatient treatment.  Emily Barajas will follow up with the services as listed below under Follow Up Information.     Upon completion of this admission the patient was both mentally and medically stable for discharge denying suicidal/homicidal ideation, auditory/visual/tactile hallucinations, delusional thoughts and paranoia.    Emily Barajas responded well to treatment with Celexa 20 mg, Zyprex 2.5 mg and 5mg  without adverse effects. Pt demonstrated improvement without reported or observed adverse effects to the point of stability appropriate for outpatient management. Pertinent labs include: Hepatitis C, for which outpatient follow-up is necessary for lab recheck as mentioned below. Reviewed CBC, CMP, BAL, and UDS; all unremarkable aside from noted exceptions.   Physical Findings: AIMS: Facial and Oral Movements Muscles of Facial Expression: None, normal Lips and Perioral Area: None, normal Jaw: None, normal Tongue: None, normal,Extremity Movements Upper (arms, wrists, hands, fingers): None, normal Lower (legs, knees, ankles, toes): None, normal, Trunk Movements Neck, shoulders, hips: None, normal, Overall Severity Severity of abnormal movements (highest score from questions above): None, normal Incapacitation due to abnormal movements: None, normal Patient's awareness of abnormal movements (rate only patient's report): No Awareness, Dental Status Current problems with teeth and/or dentures?: No Does patient usually wear dentures?: No  CIWA:  CIWA-Ar Total: 1 COWS:  COWS Total Score: 2  Musculoskeletal: Strength & Muscle Tone: within normal limits Gait & Station: normal Patient leans: N/A  Psychiatric Specialty Exam: See SRA By MD Physical Exam  Vitals reviewed. Constitutional: She appears well-developed.   Cardiovascular: Normal rate.  Psychiatric: She has a normal mood and affect. Her behavior is normal.    Review of Systems  Psychiatric/Behavioral: Negative for depression (stable) and suicidal ideas. The patient is not nervous/anxious.     Blood pressure 127/88, pulse 85, temperature 98.4 F (36.9 C), temperature source Oral, resp. rate 18, height 5\' 1"  (1.549 m), weight 54 kg (119 lb).Body mass index is 22.48 kg/m.   Have you used any form of tobacco in the last 30 days? (Cigarettes, Smokeless Tobacco, Cigars, and/or Pipes): Yes  Has this patient used any form of tobacco in the last 30 days? (Cigarettes, Smokeless Tobacco, Cigars, and/or Pipes) Yes, Yes, A prescription for an FDA-approved tobacco cessation medication was offered at discharge and the patient refused  Blood Alcohol level:  No results found for: [facility]  Metabolic Disorder Labs:  Lab Results  Component Value Date   HGBA1C 5.2 08/09/2017   MPG 102.54 08/09/2017   No results found for: PROLACTIN Lab Results  Component Value Date   CHOL 166 08/09/2017   TRIG 85 08/09/2017   HDL 56 08/09/2017   CHOLHDL 3.0 08/09/2017   VLDL 17 08/09/2017   LDLCALC 93 08/09/2017   LDLCALC 93 08/05/2017    See Psychiatric Specialty Exam and Suicide Risk Assessment completed by Attending Physician prior to discharge.  Discharge destination:  Home follow-up with [facility]  Is patient on multiple antipsychotic therapies at discharge:  No   Has  Patient had three or more failed trials of antipsychotic monotherapy by history:  No  Recommended Plan for Multiple Antipsychotic Therapies: NA  Discharge Instructions    Diet - low sodium heart healthy   Complete by:  As directed    Discharge instructions   Complete by:  As directed    Take all medications as prescribed. Keep all follow-up appointments as scheduled.  Do not consume alcohol or use illegal drugs while on prescription medications. Report any adverse effects from your  medications to your primary care provider promptly.  In the event of recurrent symptoms or worsening symptoms, call 911, a crisis hotline, or go to the nearest emergency department for evaluation.   Increase activity slowly   Complete by:  As directed      Allergies as of 08/12/2017      Reactions   Amoxicillin Anaphylaxis      Medication List    TAKE these medications     Indication  citalopram 20 MG tablet Commonly known as:  CELEXA Take 1 tablet (20 mg total) by mouth daily. Start taking on:  08/13/2017  Indication:  Depression   nicotine polacrilex 2 MG gum Commonly known as:  NICORETTE Take 1 each (2 mg total) by mouth as needed for smoking cessation.  Indication:  Nicotine Addiction   OLANZapine 2.5 MG tablet Commonly known as:  ZYPREXA Take 1 tablet (2.5 mg total) by mouth every morning. Take 2 tablets (5mg  total) by mouth at bedtime Start taking on:  08/13/2017  Indication:  Major Depressive Disorder   traZODone 50 MG tablet Commonly known as:  DESYREL Take 1 tablet (50 mg total) by mouth at bedtime as needed for sleep.  Indication:  Trouble Sleeping      Follow-up Information    [facility] Follow up.   Specialty:  Addiction Medicine Contact information: [address] [place] [address] [phone]           Follow-up recommendations:  Activity:  as tolerated Diet:  heart healthy  Comments:  Take all medications as prescribed. Keep all follow-up appointments as scheduled.  Do not consume alcohol or use illegal drugs while on prescription medications. Report any adverse effects from your medications to your primary care provider promptly.  In the event of recurrent symptoms or worsening symptoms, call 911, a crisis hotline, or go to the nearest emergency department for evaluation.   Signed: Oneta Rack, NP 08/12/2017, 9:45 AM   Patient seen, Suicide Assessment Completed.  Disposition Plan Reviewed

## 2017-08-12 NOTE — Progress Notes (Signed)
  [facility] :  Will you be returning to the same living situation after discharge:  No. Pt is planning to move in with her sister in [place], [place].  At discharge, do you have transportation home?: Yes,  sister will pick her up this afternoon.  Do you have the ability to pay for your medications: Yes,  [facility]  Release of information consent forms completed and submitted to medical records by CSW.   Patient to Follow up at: Follow-up Information    [facility] Follow up on 08/14/2017.   Why:  Hospital follow-up on Wed at 8:30AM. Please bring the following if you have it: photo ID, Medicaid card, any proof of household income if you have it. Thank you.  Contact information: [address] [place], [place] [address] Phone: [phone] Fax: [phone]          Next level of care provider has access to [facility] Link:no  Safety Planning and Suicide Prevention discussed: Yes,  SPE completed with pt; contact attempts made to reach both pt's brother and sister.   Have you used any form of tobacco in the last 30 days? (Cigarettes, Smokeless Tobacco, Cigars, and/or Pipes): Yes  Has patient been referred to the Quitline?: Patient refused referral  Patient has been referred for addiction treatment: Yes  Pulte HomesHeather N Smart, LCSW 08/12/2017, 10:22 AM

## 2017-08-12 NOTE — Progress Notes (Signed)
Recreation Therapy Notes  Date: 08/12/17 Time: 0930 Location: [address] Dayroom  Group Topic: Stress Management  Goal Area(s) Addresses:  Patient will verbalize importance of using healthy stress management.  Patient will identify positive emotions associated with healthy stress management.   Intervention: Stress Management  Activity :  Guided Imagery.  LRT introduced the stress management technique of guided imagery.  LRT read a script on letting go of things that hold us back.  Patients were to listen and follow along as LRT read script to engage in activity.  Education:  Stress Management, Discharge Planning.   Education Outcome: Acknowledges edcuation/In group clarification offered/Needs additional education  Clinical Observations/Feedback: Pt did not attend group.     Hadyn Blanck Linday, LRT/CTRS         Khaliq Turay A 08/12/2017 11:33 AM 

## 2017-08-12 NOTE — Tx Team (Signed)
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 acetaminophen (TYLENOL) tablet 650 mg  650 mg Oral Q6H PRN Nira ConnBerry, Jason A, NP      . alum & mag hydroxide-simeth (MAALOX/MYLANTA) 200-200-20 MG/5ML suspension 30 mL  30 mL Oral Q4H PRN Nira ConnBerry, Jason A, NP      . citalopram (CELEXA) tablet 20 mg  20 mg Oral Daily Oneta RackLewis, Tanika N, NP   20 mg at 08/12/17 0748  . LORazepam (ATIVAN) tablet 0.5 mg  0.5 mg Oral Q6H PRN Money, Gerlene Burdockravis B, FNP   0.5 mg at 08/11/17 2135  . magnesium hydroxide (MILK OF MAGNESIA) suspension 30 mL  30 mL Oral Daily PRN Nira ConnBerry, Jason A, NP      . nicotine polacrilex (NICORETTE) gum 2 mg  2 mg Oral PRN Cobos, Rockey SituFernando A, MD      . OLANZapine (ZYPREXA) tablet 2.5 mg  2.5 mg Oral BH-q7a Cobos, Rockey SituFernando A, MD   2.5 mg at 08/12/17 0748  . OLANZapine (ZYPREXA) tablet 5 mg  5 mg Oral QHS Cobos, Rockey SituFernando A, MD   5 mg at 08/11/17 2135  . OLANZapine zydis (ZYPREXA) disintegrating tablet 5 mg  5 mg Oral Q8H PRN Money, Gerlene Burdockravis B, FNP   5 mg at 08/11/17 1951  . traZODone (DESYREL) tablet 50 mg  50 mg Oral QHS PRN Cobos, Rockey SituFernando A, MD   50 mg at 08/11/17 2135   PTA Medications: No medications prior to admission.    Patient Stressors: Financial difficulties Health problems Loss of Mother Substance abuse  Patient Strengths: Ability for insight Communication skills  Treatment Modalities: Medication Management, Group therapy, Case management,  1 to 1 session with clinician, Psychoeducation, Recreational therapy.   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) Long Term Goal(s): Improvement in symptoms  so as ready for discharge Improvement in symptoms so as ready for discharge   Short Term Goals: Ability to identify changes in lifestyle to reduce recurrence of condition will improve Ability to verbalize feelings will improve Ability to disclose and discuss suicidal ideas Ability to demonstrate self-control will improve Ability to identify and develop effective coping behaviors will improve Ability to maintain clinical measurements within normal limits will improve Compliance with prescribed medications will improve Ability to identify triggers associated with substance abuse/mental health issues will improve Ability to identify changes in lifestyle to reduce recurrence of condition will improve Ability to verbalize feelings will improve Ability to disclose and discuss suicidal ideas Ability to demonstrate self-control will improve Ability to identify and develop effective coping behaviors will improve Ability to maintain clinical measurements within normal limits will improve Compliance with prescribed medications will improve Ability to identify triggers associated with substance abuse/mental health issues will improve  Medication Management: Evaluate patient's response, side effects, and tolerance of medication regimen.  Therapeutic Interventions: 1 to 1 sessions, Unit Group sessions and Medication administration.  Evaluation of Outcomes: Progressing  Physician Treatment Plan for Secondary Diagnosis: Principal Problem:   Substance-induced psychotic disorder (HCC)  Long Term Goal(s): Improvement in symptoms so as ready for discharge Improvement in symptoms so as ready for discharge   Short Term Goals: Ability to identify changes in lifestyle to reduce recurrence of condition will improve Ability to verbalize feelings  will improve Ability to disclose and discuss suicidal ideas Ability to demonstrate self-control will improve Ability to identify and develop effective coping  behaviors will improve Ability to maintain clinical measurements within normal limits will improve Compliance with prescribed medications will improve Ability to identify triggers associated with substance abuse/mental health issues will improve Ability to identify changes in lifestyle to reduce recurrence of condition will improve Ability to verbalize feelings will improve Ability to disclose and discuss suicidal ideas Ability to demonstrate self-control will improve Ability to identify and develop effective coping behaviors will improve Ability to maintain clinical measurements within normal limits will improve Compliance with prescribed medications will improve Ability to identify triggers associated with substance abuse/mental health issues will improve     Medication Management: Evaluate patient's response, side effects, and tolerance of medication regimen.  Therapeutic Interventions: 1 to 1 sessions, Unit Group sessions and Medication administration.  Evaluation of Outcomes: Progressing   RN Treatment Plan for Primary Diagnosis: Substance-induced psychotic disorder (HCC) Long Term Goal(s): Knowledge of disease and therapeutic regimen to maintain health will improve  Short Term Goals: Ability to identify and develop effective coping behaviors will improve and Compliance with prescribed medications will improve  Medication Management: RN will administer medications as ordered by provider, will assess and evaluate patient's response and provide education to patient for prescribed medication. RN will report any adverse and/or side effects to prescribing provider.  Therapeutic Interventions: 1 on 1 counseling sessions, Psychoeducation, Medication administration, Evaluate responses to treatment, Monitor vital signs and CBGs as ordered, Perform/monitor CIWA, COWS, AIMS and Fall Risk screenings as ordered, Perform wound care treatments as ordered.  Evaluation of Outcomes:  Progressing   LCSW Treatment Plan for Primary Diagnosis: Substance-induced psychotic disorder [facility]) Long Term Goal(s): Safe transition to appropriate next level of care at discharge, Engage patient in therapeutic group addressing interpersonal concerns.  Short Term Goals: Engage patient in aftercare planning with referrals and resources, Increase social support and Increase skills for wellness and recovery  Therapeutic Interventions: Assess for all discharge needs, 1 to 1 time with Social worker, Explore available resources and support systems, Assess for adequacy in community support network, Educate family and significant other(s) on suicide prevention, Complete Psychosocial Assessment, Interpersonal group therapy.  Evaluation of Outcomes: Progressing   Progress in Treatment: Attending groups: No. Participating in groups: No. Taking medication as prescribed: Yes. Toleration medication: Yes. Family/Significant other contact made: No, will contact:  when given permission Patient understands diagnosis: Yes. Discussing patient identified problems/goals with staff: Yes. Medical problems stabilized or resolved: Yes. Denies suicidal/homicidal ideation: Yes. Issues/concerns per patient self-inventory: No. Other: none  New problem(s) identified: No, Describe:  none  New Short Term/Long Term Goal(s): elimination of SI/AVH; detox, medication management for mood stabilization; development of comprehensive mental wellness/sobriety plan.   Discharge Plan or Barriers: : Pt interviewed with [facility]; per admissions Drema Pry), pt is not appropriate for their setting. Pt now wanting to discharge with family member and follow-up at [facility]. MHAG pamphlet and NA/AA list for [facility] provided.   Reason for Continuation of Hospitalization: Medication stabilization  Estimated Length of Stay: Monday, 08/12/17  Attendees: Patient: 08/07/2017   Physician: Dr Jama Flavors, MD; Dr. Altamese [place] MD 08/07/2017   Nursing: Burnetta Sabin RN 08/07/2017   RN Care Manager:x 08/07/2017   Social Worker: Trula Slade, LCSW 08/07/2017   Recreational Therapist: x 08/07/2017   Other: Armandina Stammer NP; Feliz Beam Money NP 08/07/2017   Other:  08/07/2017   Other: 08/07/2017    Scribe for Treatment  Team: Ledell PeoplesHeather N Smart, LCSW 08/12/2017 9:14 AM

## 2017-08-12 NOTE — Progress Notes (Signed)
Late entry for 08/11/17 1800   D Pt has spent the majority of her day asleep in her bed. When she gets up, she is observed walking down the hall. She wears hospital-supplied patient scrubs. Her hair is unkempt. She has poor hygiene. She laughs inappropriately and asks staff is she has missed designated meal  Time. A She did not complete her daily assessment but verbally contracts with writer not to hurt herself and says " I don't want to die". R Safeyt is in place. Staff to cont to meet patient where she is and encourage parrticipation in groups and milieu therapies.

## 2017-08-12 NOTE — [facility] Suicide Risk Assessment (Addendum)
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 rate 18, height 5\' 1"  (1.549 m), weight 54 kg (119 lb).Body mass index is 22.48 kg/m.  General Appearance: improving grooming   Eye Contact::  Good  Speech:  Normal Rate409  Volume:  Normal  Mood:  reports she is feeling better , currently denies depression  Affect:  reactive, not labile today  Thought Process:  Linear and Descriptions of Associations: Intact  Orientation:  Other:  fully alert and attentive   Thought Content:  denies hallucinations, no delusions, not internally preoccupied   Suicidal Thoughts:  No patient denies suicidal or self injurious ideations , denies any violent or homicidal ideations  Homicidal Thoughts:  No  Memory:  recent and remote grossly intact   Judgement:  Other:  improving   Insight:  improving   Psychomotor Activity:  Normal  Concentration:  Good  Recall:  Good  Fund of Knowledge:Good  Language: Good  Akathisia:  Negative  Handed:  Right  AIMS (if indicated):     Assets:  Communication Skills Desire for Improvement Resilience  Sleep:  Number of Hours: 6.5  Cognition: WNL  ADL's:  Intact   Mental Status Per Nursing Assessment::   On Admission:  Suicidal ideation indicated by others  Demographic Factors:  21 year old single, no children, unemployed   Loss Factors: Chronic substance abuse   Historical Factors: History of substance  abuse, reports methamphetamine is substance of choice   Risk Reduction Factors:   reports she plans to live with sister and describes family as supportive   Continued Clinical Symptoms:  Patient reports feeling better, and currently denies feeling depressed, affect presents improved, and at this time not labile .Denies suicidal or self injurious ideations, denies homicidal ideations, and denies hallucinations , no delusions are expressed . Denies medication side effects. We have reviewed medication side effects, including risk of suicidal ideations with antidepressant management in young adults, and potential metabolic, sedating, and movement disorders on Zyprexa . Also reviewed (+) Hep C finding and recommended she follow up with PCP for ongoing monitoring , management ( LFTS are unremarkable)- patient reports " they had told me 2 years ago I had it "  Presents calm and pleasant on approach at this time.  Cognitive Features That Contribute To Risk:  No gross cognitive deficits noted upon discharge. Is alert , attentive, and oriented x 3   Suicide Risk:  Mild:  Suicidal ideation of limited frequency, intensity, duration, and specificity.  There are no identifiable plans, no associated intent, mild dysphoria and related symptoms, good self-control (both objective and subjective assessment), few other risk factors, and identifiable protective factors, including available and accessible social support.  Follow-up Information    [facility] Follow up on 08/14/2017.   Why:  Hospital follow-up on Wed at 8:30AM. Please bring the following if you have it: photo ID, Medicaid card, any proof of household income if you have it. Thank you.  Contact information: [address] [place], [place] [address]  Phone: [phone] Fax: [phone]          Plan Of Care/Follow-up recommendations:  Activity:  as tolerated  Diet:  regular Tests:  NA Other:  See below  Patient is expressing readiness  for discharge and is leaving unit in good spirits. Plans to follow up as above. We have reviewed importance of maintaining focus on sobriety/abstinence from illicit drugs and have encouraged NA participation, and importance of outpatient medical management /monitoring regarding Hep C.    Craige CottaFernando A Haydin Dunn, MD 08/12/2017, 10:36 AM

## 2017-08-12 NOTE — Progress Notes (Signed)
Discharge Note:  Patient discharged home with family member.  Patient denied SI and HI.  Denied A/V hallucinations.  Denied pain.  Suicide prevention information given and discussed with patient who stated she understood and had no questions.  Patient stated she appreciates all [facility].  Patient stated she received all her belongings.  All required discharge information given to patient at discharge.

## 2017-08-12 NOTE — Progress Notes (Signed)
Post Discharge note:  Called patient 12.31.18 @ 1652 per Dr. Jama Flavorsobos' order.  Patient's results indicate that she is positive for Neisseria gonorrhea.  Patient was contacted and information was given.  Informed patient that she needed to follow up with health department.  Patient was appreciative of phone call.

## 2020-01-05 DIAGNOSIS — A6000 Herpesviral infection of urogenital system, unspecified: Secondary | ICD-10-CM | POA: Insufficient documentation

## 2020-02-09 LAB — PROTIME-INR: INR: 1 (ref 0.9–1.1)

## 2020-02-16 ENCOUNTER — Other Ambulatory Visit: Payer: Self-pay | Admitting: General Practice

## 2020-02-16 ENCOUNTER — Other Ambulatory Visit (HOSPITAL_COMMUNITY): Payer: Self-pay | Admitting: General Practice

## 2020-02-16 DIAGNOSIS — B1920 Unspecified viral hepatitis C without hepatic coma: Secondary | ICD-10-CM

## 2020-03-04 ENCOUNTER — Other Ambulatory Visit: Payer: Self-pay

## 2020-03-04 ENCOUNTER — Ambulatory Visit (HOSPITAL_COMMUNITY)
Admission: RE | Admit: 2020-03-04 | Discharge: 2020-03-04 | Disposition: A | Discharge status: Home / Self Care | Payer: Self-pay | Source: Ambulatory Visit | Attending: General Practice | Admitting: General Practice

## 2020-03-04 DIAGNOSIS — B1920 Unspecified viral hepatitis C without hepatic coma: Secondary | ICD-10-CM | POA: Insufficient documentation

## 2020-05-10 ENCOUNTER — Encounter: Payer: Self-pay | Admitting: Internal Medicine

## 2020-05-19 ENCOUNTER — Other Ambulatory Visit: Payer: Self-pay

## 2020-05-19 ENCOUNTER — Encounter: Payer: Self-pay | Admitting: Internal Medicine

## 2020-05-19 ENCOUNTER — Ambulatory Visit (INDEPENDENT_AMBULATORY_CARE_PROVIDER_SITE_OTHER): Payer: 59 | Admitting: Internal Medicine

## 2020-05-19 DIAGNOSIS — B1920 Unspecified viral hepatitis C without hepatic coma: Secondary | ICD-10-CM | POA: Insufficient documentation

## 2020-05-19 DIAGNOSIS — F1911 Other psychoactive substance abuse, in remission: Secondary | ICD-10-CM | POA: Diagnosis not present

## 2020-05-19 DIAGNOSIS — B182 Chronic viral hepatitis C: Secondary | ICD-10-CM | POA: Diagnosis not present

## 2020-05-19 NOTE — Patient Instructions (Signed)
We will start the process of obtaining hepatitis C treatment for you.  I would like to use Mavyret x8 weeks though we may need to choose a different medication pending your insurance approval.  Follow with GI in 3 months or sooner if needed.  At [facility] we value your feedback. You may receive a survey about your visit today. Please share your experience as we strive to create trusting relationships with our patients to provide genuine, compassionate, quality care.  We appreciate your understanding and patience as we review any laboratory studies, imaging, and other diagnostic tests that are ordered as we care for you. Our office policy is 5 business days for review of these results, and any emergent or urgent results are addressed in a timely manner for your best interest. If you do not hear from our office in 1 week, please contact us.   We also encourage the use of MyChart, which contains your medical information for your review as well. If you are not enrolled in this feature, an access code is on this after visit summary for your convenience. Thank you for allowing [place] to be involved in your care.  It was great to see you today!  I hope you have a great rest of your fall!!    Callista Hoh K. Marletta Lor, D.O. Gastroenterology and Hepatology [facility]

## 2020-05-19 NOTE — Progress Notes (Signed)
Primary Care Physician:  Lucius Conn, NP Primary Gastroenterologist:  Dr. Marletta Lor  Chief Complaint  Patient presents with  . Hepatitis C    HPI:   Emily Barajas is a 24 y.o. female who presents to the clinic today by referral from her PCP [facility] for evaluation for chronic hepatitis C.  Patient states she was diagnosed with hepatitis C at age 53.  She has a history of IV drug abuse, mainly methamphetamines.  She has been sober for nearly 30 months now and is doing well.  She is genotype 1a with recent viral load of 45,000.  His recent LFTs were WNL.  She had an ultrasound with elastography which was normal.  Patient denies any abdominal pain.  No fevers or fatigue.  No family history of colorectal malignancy.  Otherwise she has no other complaints.  Past Medical History:  Diagnosis Date  . Anxiety   . Asthma   . Headache   . Hepatitis   . Seizures (HCC)    Pt does not remember when last one was, states "it's been awhile"    No past surgical history on file.  Current Outpatient Medications  Medication Sig Dispense Refill  . Norethindrone Acetate-Ethinyl Estrad-FE (MICROGESTIN 24 FE) 1-20 MG-MCG(24) tablet Take 1 tablet by mouth daily.    . valACYclovir (VALTREX) 500 MG tablet Take 500 mg by mouth daily.     No current facility-administered medications for this visit.    Allergies as of 05/19/2020 - Review Complete 05/19/2020  Allergen Reaction Noted  . Amoxicillin Anaphylaxis 08/03/2017    No family history on file.  Social History   Socioeconomic History  . Marital status: Single    Spouse name: Not on file  . Number of children: Not on file  . Years of education: Not on file  . Highest education level: Not on file  Occupational History  . Not on file  Tobacco Use  . Smoking status: Current Every Day Smoker    Packs/day: 1.00    Types: Cigarettes  . Smokeless tobacco: Never Used  Substance and Sexual Activity  . Alcohol use: Yes    Comment:  occas  . Drug use: Not Currently    Frequency: 7.0 times per week    Types: Amphetamines, Marijuana    Comment: cocaine, meth, heroin, oxy, xanax, morphine 30 months since last use  . Sexual activity: Not Currently  Other Topics Concern  . Not on file  Social History Narrative  . Not on file   Social Determinants of Health   Financial Resource Strain:   . Difficulty of Paying Living Expenses: Not on file  Food Insecurity:   . Worried About Programme researcher, broadcasting/film/video in the Last Year: Not on file  . Ran Out of Food in the Last Year: Not on file  Transportation Needs:   . Lack of Transportation (Medical): Not on file  . Lack of Transportation (Non-Medical): Not on file  Physical Activity:   . Days of Exercise per Week: Not on file  . Minutes of Exercise per Session: Not on file  Stress:   . Feeling of Stress : Not on file  Social Connections:   . Frequency of Communication with Friends and Family: Not on file  . Frequency of Social Gatherings with Friends and Family: Not on file  . Attends Religious Services: Not on file  . Active Member of Clubs or Organizations: Not on file  . Attends Banker Meetings: Not  on file  . Marital Status: Not on file  Intimate Partner Violence:   . Fear of Current or Ex-Partner: Not on file  . Emotionally Abused: Not on file  . Physically Abused: Not on file  . Sexually Abused: Not on file    Subjective: Review of Systems  Constitutional: Negative for chills and fever.  HENT: Negative for congestion and hearing loss.   Eyes: Negative for blurred vision and double vision.  Respiratory: Negative for cough and shortness of breath.   Cardiovascular: Negative for chest pain and palpitations.  Gastrointestinal: Negative for abdominal pain, blood in stool, constipation, diarrhea, heartburn, melena and vomiting.  Genitourinary: Negative for dysuria and urgency.  Musculoskeletal: Negative for joint pain and myalgias.  Skin: Negative for  itching and rash.  Neurological: Negative for dizziness and headaches.  Psychiatric/Behavioral: Negative for depression. The patient is not nervous/anxious.        Objective: BP 122/76   Pulse 76   Temp (!) 97.5 F (36.4 C) (Oral)   Ht 5\' 3"  (1.6 m)   Wt 135 lb 3.2 oz (61.3 kg)   LMP 05/05/2020   BMI 23.95 kg/m  Physical Exam Constitutional:      Appearance: Normal appearance.  HENT:     Head: Normocephalic and atraumatic.  Eyes:     Extraocular Movements: Extraocular movements intact.     Conjunctiva/sclera: Conjunctivae normal.  Cardiovascular:     Rate and Rhythm: Normal rate and regular rhythm.  Pulmonary:     Effort: Pulmonary effort is normal.     Breath sounds: Normal breath sounds.  Abdominal:     General: Bowel sounds are normal.     Palpations: Abdomen is soft.  Musculoskeletal:        General: No swelling. Normal range of motion.     Cervical back: Normal range of motion and neck supple.  Skin:    General: Skin is warm and dry.     Coloration: Skin is not jaundiced.  Neurological:     General: No focal deficit present.     Mental Status: She is alert and oriented to person, place, and time.  Psychiatric:        Mood and Affect: Mood normal.        Behavior: Behavior normal.      Assessment: *Chronic hepatitis C-genotype 1a, viral load 45,000 *History of IV drug use-sober for 30 months  Plan: We will start the process of obtaining treatment for patient.  We will attempt to treat her with Mavyret x8 weeks.  We may need to choose another option if her insurance does not approve.  I discuseds side effect profile with patient and she is agreeable.  She will need viral load checked 3 months after treatment to ensure SVR.  Patient to follow-up with me in 3 months or sooner if needed.  05/19/2020 10:35 AM   Disclaimer: This note was dictated with voice recognition software. Similar sounding words can inadvertently be transcribed and may not be corrected  upon review.

## 2020-06-01 ENCOUNTER — Telehealth: Payer: Self-pay | Admitting: *Deleted

## 2020-06-01 NOTE — Telephone Encounter (Signed)
Received fax from [employer]. PA approved for Mavyret through 07/19/2020. Patient insurance requires she go through [employer] (p) [phone].   Called [employer] and was advised they are reaching out to patient. Once this is done they will call the office to set up shipment. [employer] is aware both shipments are to be sent to our office and not patient.  Called patient and she is aware to expect a call from [employer]. She voiced understanding

## 2020-06-08 NOTE — Telephone Encounter (Signed)
Received a call from the specialty pharmacy. Pt's birth control interacts with Mavyret. The pharmcy would like to know if Dr. Marletta Lor would like pt to hold birth control pills while on Mavyret? Please advise. [phone]

## 2020-06-08 NOTE — Telephone Encounter (Signed)
Received a call from [employer]. Mavyret is scheduled for delivery to our office tomorrow 06/09/20.

## 2020-06-08 NOTE — Telephone Encounter (Signed)
Forwarding to Dr. Marletta Lor to provide instructions for patient

## 2020-06-09 ENCOUNTER — Encounter: Payer: Self-pay | Admitting: *Deleted

## 2020-06-09 NOTE — Telephone Encounter (Signed)
Patient will need to hold her OCP for the duration of her treatment and use other means of contraception during this time.  If she would like to stay on birth control, she can talk with her OB/GYN about other options for the interim.  Thank you

## 2020-06-09 NOTE — Telephone Encounter (Signed)
Called and confirmed with patient she is taking her birth control once a day. Shipment has been received in office. She states if she needs to she can come off birth control to start treatment. Please advise Dr. Marletta Lor

## 2020-06-13 ENCOUNTER — Ambulatory Visit (INDEPENDENT_AMBULATORY_CARE_PROVIDER_SITE_OTHER): Payer: 59 | Admitting: Advanced Practice Midwife

## 2020-06-13 ENCOUNTER — Telehealth: Payer: Self-pay | Admitting: Internal Medicine

## 2020-06-13 ENCOUNTER — Encounter: Payer: Self-pay | Admitting: Advanced Practice Midwife

## 2020-06-13 VITALS — BP 128/78 | HR 72 | Ht 61.0 in | Wt 139.5 lb

## 2020-06-13 DIAGNOSIS — B182 Chronic viral hepatitis C: Secondary | ICD-10-CM

## 2020-06-13 DIAGNOSIS — N858 Other specified noninflammatory disorders of uterus: Secondary | ICD-10-CM | POA: Diagnosis not present

## 2020-06-13 DIAGNOSIS — Z3202 Encounter for pregnancy test, result negative: Secondary | ICD-10-CM

## 2020-06-13 LAB — POCT URINE PREGNANCY: Preg Test, Ur: NEGATIVE

## 2020-06-13 NOTE — Telephone Encounter (Signed)
PATIENT CALLED AND SAID THAT SHE WAS SUPPOSED TO HAVE A MEDICATION SENT HERE AND SHE WAS ASKING IF IT HAS ARRIVED.  [phone]

## 2020-06-13 NOTE — Telephone Encounter (Signed)
Dr.Carver, what other instructions would you like for [place] to give the patient about Mavyret when she comes to pick up medication?

## 2020-06-13 NOTE — Patient Instructions (Signed)
Decidual cast

## 2020-06-13 NOTE — Telephone Encounter (Signed)
Called and spoke with pt advised we have her medication just waiting on instructions from Dr. Marletta Lor and we would let her know.

## 2020-06-13 NOTE — Progress Notes (Signed)
[facility] Visit  Patient name: Emily Barajas MRN [id number]  Date of birth: 07-30-1996  CC & HPI:  Emily Barajas is a 24 y.o.  female presenting today for ? SAB.  On COCs, passed what she thought could be a fetus a few days prior to starting her (4) placebos.  Getting ready to start Hep C meds, which, combined w/ COCs, could increase her LE. Plans condoms in the interim.   Pertinent History Reviewed:  Medical & Surgical Hx:   Past Medical History:  Diagnosis Date  . Anxiety   . Asthma   . Headache   . Hepatitis   . Seizures (HCC)    Pt does not remember when last one was, states "it's been awhile"   History reviewed. No pertinent surgical history. Family History  Problem Relation Age of Onset  . Cancer Paternal Grandfather   . Other Paternal Grandfather        on dialysis  . Heart attack Maternal Grandfather   . Bipolar disorder Father   . Schizophrenia Father   . Alcohol abuse Father   . Stroke Mother   . Polycystic ovary syndrome Sister   . Depression Sister   . Anxiety disorder Sister     Current Outpatient Medications:  .  albuterol (VENTOLIN HFA) 108 (90 Base) MCG/ACT inhaler, Inhale into the lungs., Disp: , Rfl:  .  Norethin Ace-Eth Estrad-FE (BLISOVI 24 FE PO), Take by mouth daily., Disp: , Rfl:  .  prazosin (MINIPRESS) 1 MG capsule, Take 1 mg by mouth at bedtime., Disp: , Rfl:  .  valACYclovir HCl (VALTREX PO), Take by mouth daily., Disp: , Rfl:  Social History: Reviewed -  reports that she has quit smoking. Her smoking use included cigarettes. She smoked 1.00 pack per day. She has quit using smokeless tobacco.  Her smokeless tobacco use included chew.  Review of Systems:   Constitutional: Negative for fever and chills Eyes: Negative for visual disturbances Respiratory: Negative for shortness of breath, dyspnea Cardiovascular: Negative for chest pain or palpitations  Gastrointestinal: Negative for vomiting, diarrhea and constipation; no  abdominal pain Genitourinary: Negative for dysuria and urgency, vaginal irritation or itching Musculoskeletal: Negative for back pain, joint pain, myalgias  Neurological: Negative for dizziness and headaches    Objective Findings:    Physical Examination: Vitals:   06/13/20 0841  BP: 128/78  Pulse: 72   General appearance - well appearing, and in no distress Mental status - alert, oriented to person, place, and time Chest:  Normal respiratory effort Heart - normal rate and regular rhythm Musculoskeletal:  Normal range of motion without pain Extremities:  No edema  Photo looks like a decidual cast  Results for orders placed or performed in visit on 06/13/20 (from the past 24 hour(s))  POCT urine pregnancy   Collection Time: 06/13/20  8:53 AM  Result Value Ref Range   Preg Test, Ur Negative Negative      Assessment & Plan:  A:   Decidual cast P:  Phexxi samples given, use w/condoms (declined other non hormonal BC)   Return for If you have any problems.  Jacklyn Shell CNM 06/13/2020 9:56 AM

## 2020-06-15 NOTE — Addendum Note (Signed)
Addended by: Armstead Peaks on: 06/15/2020 02:11 PM   Modules accepted: Orders

## 2020-06-15 NOTE — Telephone Encounter (Signed)
Called patient. She states she stopped taking her birth control last Wednesday and she will continue to not take it for the duration of treatment. She is going to come by the office today to pick up medication.

## 2020-06-15 NOTE — Telephone Encounter (Signed)
Patient signed paperwork and medication picked up

## 2020-06-15 NOTE — Telephone Encounter (Signed)
Patient to take three tablets (glecaprevir 100 mg/pibrentasvir 40 mg per tablet) once daily for 8 weeks. She needs to call if she starts taking any new medications to ensure no contraindications.

## 2020-06-22 ENCOUNTER — Telehealth: Payer: Self-pay

## 2020-06-22 NOTE — Telephone Encounter (Signed)
VM received. Pt needs to speak with someone about her Hep C medication. Pt is taking the medication three times daily (breakfast, lunch and dinner) and realized she is suppose to take 3 pills daily at the same time. Spoke with Dr. Marletta Lor and pt can start taking the medication all at this same time starting today and pts treatment hasn't failed since pt has taking the 3 pills daily. Spoke with pt. Pt is aware of this.

## 2020-06-30 ENCOUNTER — Telehealth: Payer: Self-pay

## 2020-06-30 NOTE — Telephone Encounter (Signed)
[facility] called, pts medication delivery is scheduled for Tuesday 07/04/20.

## 2020-07-05 NOTE — Telephone Encounter (Signed)
Medication received. Called pt and she will come by tomorrow AM and will pick up medication

## 2020-07-06 NOTE — Telephone Encounter (Signed)
Patient came by office and picked up medication.

## 2020-08-17 ENCOUNTER — Ambulatory Visit: Payer: 59 | Admitting: Internal Medicine

## 2020-09-15 ENCOUNTER — Encounter: Payer: Self-pay | Admitting: Internal Medicine

## 2020-09-15 ENCOUNTER — Ambulatory Visit: Payer: 59 | Admitting: Internal Medicine

## 2020-11-15 ENCOUNTER — Telehealth: Payer: Self-pay

## 2020-11-15 NOTE — Telephone Encounter (Signed)
We keep getting a viral response lab request from [facility] regarding the pt's bloodwork. I have faxed them numerous times regarding the last office visit was 05/19/2020 and she picked up her medication on 06/30/2020. This pt had instructions to return here in 3 months. Pt never showed. I'm faxing [facility] again today regarding this matter.

## 2020-12-15 ENCOUNTER — Other Ambulatory Visit: Payer: Self-pay

## 2020-12-15 ENCOUNTER — Ambulatory Visit (INDEPENDENT_AMBULATORY_CARE_PROVIDER_SITE_OTHER): Payer: 59 | Admitting: *Deleted

## 2020-12-15 VITALS — BP 132/83 | HR 66 | Ht 61.0 in | Wt 138.5 lb

## 2020-12-15 DIAGNOSIS — Z3201 Encounter for pregnancy test, result positive: Secondary | ICD-10-CM

## 2020-12-15 DIAGNOSIS — N926 Irregular menstruation, unspecified: Secondary | ICD-10-CM

## 2020-12-15 LAB — POCT URINE PREGNANCY: Preg Test, Ur: POSITIVE — AB

## 2020-12-15 NOTE — Progress Notes (Signed)
Chart reviewed for nurse visit. Agree with plan of care.  Adline Potter, NP 12/15/2020 1:37 PM

## 2020-12-15 NOTE — Progress Notes (Signed)
   NURSE VISIT- PREGNANCY CONFIRMATION   SUBJECTIVE:  Emily Barajas is a 25 y.o. G1P0000 female at [redacted]w[redacted]d by certain LMP of Patient's last menstrual period was 10/25/2020. Here for pregnancy confirmation.  Home pregnancy test: positive x 2  She reports no complaints.  She is not taking prenatal vitamins.    OBJECTIVE:  BP 132/83 (BP Location: Right Arm, Patient Position: Sitting, Cuff Size: Normal)   Pulse 66   Ht 5\' 1"  (1.549 m)   Wt 138 lb 8 oz (62.8 kg)   LMP 10/25/2020   BMI 26.17 kg/m   Appears well, in no apparent distress OB History  Gravida Para Term Preterm AB Living  1 0 0 0 0 0  SAB IAB Ectopic Multiple Live Births  0 0 0 0 0    # Outcome Date GA Lbr Len/2nd Weight Sex Delivery Anes PTL Lv  1 Current             Results for orders placed or performed in visit on 12/15/20 (from the past 24 hour(s))  POCT urine pregnancy   Collection Time: 12/15/20 11:26 AM  Result Value Ref Range   Preg Test, Ur Positive (A) Negative    ASSESSMENT: Positive pregnancy test, [redacted]w[redacted]d by LMP    PLAN: Schedule for dating ultrasound in 2-3 weeks Prenatal vitamins: plans to begin OTC ASAP   Nausea medicines: not currently needed   OB packet given: Yes  [redacted]w[redacted]d  12/15/2020 12:24 PM

## 2020-12-19 ENCOUNTER — Telehealth: Payer: Self-pay | Admitting: Adult Health

## 2020-12-19 NOTE — Telephone Encounter (Signed)
Pt wanting something for [place]

## 2020-12-20 MED ORDER — PROMETHAZINE HCL 25 MG RE SUPP: 25.0000 mg | Freq: Four times a day (QID) | RECTAL | 1 refills | Status: DC | PRN

## 2020-12-20 NOTE — Telephone Encounter (Signed)
[place] is complaining of dry heaves and vomiting, can make spit and still peeing, will rx phenergan supp, get Pedialyte and popiciles and then ad gold fish and creamed potatoes, to diet, denies  any diarrhea. Keep [place] posted on how she feels

## 2020-12-20 NOTE — Telephone Encounter (Signed)
Pt called to check on message, states she is unable to keep anything down, lots of vomiting, bad nausea.  Not sure is it's morning sickness or a virus  Would like to know if we can order any meds.  If we can't states she may need to go to the ER  Pt is pregnant & it was confirmed here & is scheduled for dating U/S 01/11/2021    [facility]

## 2020-12-21 ENCOUNTER — Other Ambulatory Visit: Payer: Self-pay

## 2020-12-21 ENCOUNTER — Telehealth: Payer: Self-pay | Admitting: Women's Health

## 2020-12-21 ENCOUNTER — Encounter (HOSPITAL_COMMUNITY): Payer: Self-pay | Admitting: *Deleted

## 2020-12-21 ENCOUNTER — Emergency Department (HOSPITAL_COMMUNITY)
Admission: EM | Admit: 2020-12-21 | Discharge: 2020-12-21 | Disposition: A | Discharge status: Home / Self Care | Payer: 59 | Attending: Emergency Medicine | Admitting: Emergency Medicine

## 2020-12-21 DIAGNOSIS — J45909 Unspecified asthma, uncomplicated: Secondary | ICD-10-CM | POA: Insufficient documentation

## 2020-12-21 DIAGNOSIS — F419 Anxiety disorder, unspecified: Secondary | ICD-10-CM | POA: Diagnosis not present

## 2020-12-21 DIAGNOSIS — R109 Unspecified abdominal pain: Secondary | ICD-10-CM | POA: Diagnosis not present

## 2020-12-21 DIAGNOSIS — Z87891 Personal history of nicotine dependence: Secondary | ICD-10-CM | POA: Insufficient documentation

## 2020-12-21 DIAGNOSIS — R112 Nausea with vomiting, unspecified: Secondary | ICD-10-CM | POA: Diagnosis not present

## 2020-12-21 LAB — URINALYSIS, ROUTINE W REFLEX MICROSCOPIC
Bacteria, UA: NONE SEEN
Bilirubin Urine: NEGATIVE
Bilirubin Urine: NEGATIVE
Glucose, UA: NEGATIVE mg/dL
Glucose, UA: NEGATIVE mg/dL
Hgb urine dipstick: NEGATIVE
Hgb urine dipstick: NEGATIVE
Ketones, ur: 80 mg/dL — AB
Ketones, ur: 80 mg/dL — AB
Leukocytes,Ua: NEGATIVE
Nitrite: NEGATIVE
Nitrite: NEGATIVE
Protein, ur: 100 mg/dL — AB
Protein, ur: 30 mg/dL — AB
Specific Gravity, Urine: 1.029 (ref 1.005–1.030)
Specific Gravity, Urine: 1.031 — ABNORMAL HIGH (ref 1.005–1.030)
pH: 7 (ref 5.0–8.0)
pH: 7 (ref 5.0–8.0)

## 2020-12-21 LAB — COMPREHENSIVE METABOLIC PANEL
ALT: 15 U/L (ref 0–44)
AST: 18 U/L (ref 15–41)
Albumin: 4.6 g/dL (ref 3.5–5.0)
Alkaline Phosphatase: 48 U/L (ref 38–126)
Anion gap: 9 (ref 5–15)
BUN: 13 mg/dL (ref 6–20)
CO2: 21 mmol/L — ABNORMAL LOW (ref 22–32)
Calcium: 9.4 mg/dL (ref 8.9–10.3)
Chloride: 105 mmol/L (ref 98–111)
Creatinine, Ser: 0.57 mg/dL (ref 0.44–1.00)
GFR, Estimated: >60 mL/min (ref >60)
Glucose, Bld: 105 mg/dL — ABNORMAL HIGH (ref 70–99)
Potassium: 3.6 mmol/L (ref 3.5–5.1)
Sodium: 135 mmol/L (ref 135–145)
Total Bilirubin: 1.5 mg/dL — ABNORMAL HIGH (ref 0.3–1.2)
Total Protein: 7.5 g/dL (ref 6.5–8.1)

## 2020-12-21 LAB — CBC WITH DIFFERENTIAL/PLATELET
Abs Immature Granulocytes: 0.06 10*3/uL (ref 0.00–0.07)
Basophils Absolute: 0.1 10*3/uL (ref 0.0–0.1)
Basophils Relative: 1 %
Eosinophils Absolute: 0 10*3/uL (ref 0.0–0.5)
Eosinophils Relative: 0 %
HCT: 42.1 % (ref 36.0–46.0)
Hemoglobin: 14.8 g/dL (ref 12.0–15.0)
Immature Granulocytes: 1 %
Lymphocytes Relative: 19 %
Lymphs Abs: 2.2 10*3/uL (ref 0.7–4.0)
MCH: 31.3 pg (ref 26.0–34.0)
MCHC: 35.2 g/dL (ref 30.0–36.0)
MCV: 89 fL (ref 80.0–100.0)
Monocytes Absolute: 1 10*3/uL (ref 0.1–1.0)
Monocytes Relative: 8 %
Neutro Abs: 8.1 10*3/uL — ABNORMAL HIGH (ref 1.7–7.7)
Neutrophils Relative %: 71 %
Platelets: 398 10*3/uL (ref 150–400)
RBC: 4.73 MIL/uL (ref 3.87–5.11)
RDW: 12.1 % (ref 11.5–15.5)
WBC: 11.4 10*3/uL — ABNORMAL HIGH (ref 4.0–10.5)
nRBC: 0 % (ref 0.0–0.2)

## 2020-12-21 LAB — HCG, QUANTITATIVE, PREGNANCY: hCG, Beta Chain, Quant, S: 42104 m[IU]/mL — ABNORMAL HIGH (ref <5)

## 2020-12-21 MED ORDER — SODIUM CHLORIDE 0.9 % IV SOLN
25.0000 mg | Freq: Four times a day (QID) | INTRAVENOUS | Status: DC | PRN
  Administered 2020-12-21: 25 mg via INTRAVENOUS
  Filled 2020-12-21: qty 1

## 2020-12-21 MED ORDER — ALUM & MAG HYDROXIDE-SIMETH 200-200-20 MG/5ML PO SUSP
30.0000 mL | Freq: Once | ORAL | Status: AC
  Administered 2020-12-21: 30 mL via ORAL
  Filled 2020-12-21: qty 30

## 2020-12-21 MED ORDER — LIDOCAINE VISCOUS HCL 2 % MT SOLN
15.0000 mL | Freq: Once | OROMUCOSAL | Status: AC
  Administered 2020-12-21: 15 mL via ORAL
  Filled 2020-12-21: qty 15

## 2020-12-21 MED ORDER — HYDROXYZINE HCL 50 MG/ML IM SOLN
25.0000 mg | Freq: Once | INTRAMUSCULAR | Status: AC
  Administered 2020-12-21: 25 mg via INTRAMUSCULAR
  Filled 2020-12-21: qty 1

## 2020-12-21 MED ORDER — SODIUM CHLORIDE 0.9 % IV BOLUS
1000.0000 mL | Freq: Once | INTRAVENOUS | Status: AC
  Administered 2020-12-21: 1000 mL via INTRAVENOUS

## 2020-12-21 NOTE — ED Notes (Signed)
Nausea and anxiety improved.  PT resting.

## 2020-12-21 NOTE — Discharge Instructions (Signed)
Exam and vital signs are reassuring, please continue with your at home medications as prescribed.  I recommend a bland diet to help prevent future nausea and vomiting.  Your T bili slightly elevated today, I would like CMP repeated at your next OB/GYN visit for further evaluation.  Please follow-up at your OB/GYN in 2 days time for repeat evaluation.  Come back to the emergency department if you develop chest pain, shortness of breath, severe abdominal pain, uncontrolled nausea, vomiting, diarrhea.

## 2020-12-21 NOTE — Telephone Encounter (Signed)
Pt seen at [facility] ED today, they recommended f/up here in a couple days due to some of her lab results  Please review ED notes & lab results & advise on scheduling needs  Is scheduled for dating U/S 01/11/2021   Please advise & call pt

## 2020-12-21 NOTE — ED Provider Notes (Signed)
[facility] EMERGENCY DEPARTMENT Provider Note   CSN: [id number] Arrival date & time: 12/21/20  2197     History No chief complaint on file.   Emily Barajas is a 25 y.o. female.  HPI   Patient with significant medical history of anxiety, asthma, [redacted] weeks pregnant presents to the emergency department with chief complaint of nausea and vomiting as well as anxiety.  Patient states over the last 5 days she has been unable to tolerate p.o., states she has been vomiting consistently, she has been taking her suppository Phenergan without  relief, states it only last about 3 hours and then she vomits again.  She states that she feels extremely dehydrated she has been unable to tolerate anything.  She also states that her abdomen hurts slightly, only hurts when she is vomiting, feels tightness in her in her stomach, she denies recent trauma to her abdomen or pelvis, denies vaginal bleeding, states she has some discharge but does not feel it is any different from usual.  She denies urinary symptoms.  Patient spoke with her OB/GYN and they told her to come here for further evaluation.  She is scheduled to have her outpatient ultrasound in a few weeks.  Patient denies headaches, fevers, chills, shortness of breath, chest pain, diarrhea, or pedal edema.  Past Medical History:  Diagnosis Date  . Anxiety   . Asthma   . Headache   . Hepatitis    c-pt reports she has received treatment  . HSV infection   . Seizures ([place])    Pt does not remember when last one was, states "it's been awhile"    Patient Active Problem List   Diagnosis Date Noted  . Hepatitis C 05/19/2020  . History of substance abuse ([place]) 05/19/2020  . 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   OB History    Gravida  1   Para  0   Term  0   Preterm  0   AB  0   Living  0     SAB  0   IAB  0   Ectopic  0   Multiple  0   Live Births  0           Family  History  Problem Relation Age of Onset  . Cancer Paternal Grandfather   . Other Paternal Grandfather        on dialysis  . Heart attack Maternal Grandfather   . Bipolar disorder Father   . Schizophrenia Father   . Alcohol abuse Father   . Stroke Mother   . Polycystic ovary syndrome Sister   . Depression Sister   . Anxiety disorder Sister     Social History   Tobacco Use  . Smoking status: Former Smoker    Packs/day: 1.00    Types: Cigarettes  . Smokeless tobacco: Former Systems developer    Types: Secondary school teacher  . Vaping Use: Every day  Substance Use Topics  . Alcohol use: Not Currently    Comment: occas  . Drug use: Not Currently    Frequency: 7.0 times per week    Types: Amphetamines, Marijuana    Comment: cocaine, meth, heroin, oxy, xanax, morphine 30 months since last use    Home Medications Prior to Admission medications   Medication Sig Start Date End Date Taking? Authorizing Provider  hydrOXYzine (VISTARIL) 50 MG capsule Take 50 mg by mouth 3 (three) times daily. 07/01/20  Yes [provider]  promethazine (PHENERGAN) 25 MG suppository Place 1 suppository (25 mg total) rectally every 6 (six) hours as needed for nausea or vomiting. 12/20/20  Yes Derrek Monaco A, NP  valACYclovir (VALTREX) 500 MG tablet Take 500 mg by mouth daily. 10/13/20  Yes [provider]  venlafaxine XR (EFFEXOR-XR) 37.5 MG 24 hr capsule Take 37.5 mg by mouth every morning. 11/22/20  Yes [provider]  albuterol (VENTOLIN HFA) 108 (90 Base) MCG/ACT inhaler Inhale 1-2 puffs into the lungs every 6 (six) hours as needed. Patient not taking: Reported on 12/21/2020 02/24/20   [provider]    Allergies    Amoxicillin and Penicillin g  Review of Systems   Review of Systems  Constitutional: Negative for chills and fever.  HENT: Negative for congestion and sore throat.   Respiratory: Negative for shortness of breath.   Cardiovascular: Negative for chest pain.   Gastrointestinal: Positive for abdominal pain, nausea and vomiting.  Genitourinary: Negative for difficulty urinating, enuresis, vaginal bleeding and vaginal pain.  Musculoskeletal: Negative for back pain.  Skin: Negative for rash.  Neurological: Negative for headaches.  Hematological: Does not bruise/bleed easily.    Physical Exam Updated Vital Signs BP 121/83 (BP Location: Left Arm)   Pulse 64   Temp 98.4 F (36.9 C) (Oral)   Resp 18   Ht 5' 1.75" (1.568 m)   Wt 62.6 kg   LMP 10/25/2020   SpO2 100%   BMI 25.45 kg/m   Physical Exam Vitals and nursing note reviewed.  Constitutional:      General: She is not in acute distress.    Appearance: She is not ill-appearing.  HENT:     Head: Normocephalic and atraumatic.     Nose: No congestion.     Mouth/Throat:     Mouth: Mucous membranes are dry.     Pharynx: Oropharynx is clear. No oropharyngeal exudate or posterior oropharyngeal erythema.  Eyes:     Conjunctiva/sclera: Conjunctivae normal.  Cardiovascular:     Rate and Rhythm: Normal rate and regular rhythm.     Pulses: Normal pulses.     Heart sounds: No murmur heard. No friction rub. No gallop.   Pulmonary:     Effort: No respiratory distress.     Breath sounds: No wheezing, rhonchi or rales.  Abdominal:     Palpations: Abdomen is soft.     Tenderness: There is no abdominal tenderness. There is no right CVA tenderness or left CVA tenderness.     Comments: Abdomen was nondistended, normoactive bowel sounds, dull to percussion.  Can feel the fundus that the pubic symphysis  Musculoskeletal:     Right lower leg: No edema.     Left lower leg: No edema.  Skin:    General: Skin is warm and dry.  Neurological:     Mental Status: She is alert.  Psychiatric:        Mood and Affect: Mood normal.     ED Results / Procedures / Treatments   Labs (all labs ordered are listed, but only abnormal results are displayed) Labs Reviewed  CBC WITH DIFFERENTIAL/PLATELET -  Abnormal; Notable for the following components:      Result Value   WBC 11.4 (*)    Neutro Abs 8.1 (*)    All other components within normal limits  COMPREHENSIVE METABOLIC PANEL - Abnormal; Notable for the following components:   CO2 21 (*)    Glucose, Bld 105 (*)  Total Bilirubin 1.5 (*)    All other components within normal limits  URINALYSIS, ROUTINE W REFLEX MICROSCOPIC - Abnormal; Notable for the following components:   Color, Urine AMBER (*)    APPearance HAZY (*)    Ketones, ur 80 (*)    Protein, ur 100 (*)    Leukocytes,Ua TRACE (*)    All other components within normal limits  HCG, QUANTITATIVE, PREGNANCY - Abnormal; Notable for the following components:   hCG, Beta Chain, Quant, S 42,104 (*)    All other components within normal limits  URINALYSIS, ROUTINE W REFLEX MICROSCOPIC - Abnormal; Notable for the following components:   APPearance HAZY (*)    Specific Gravity, Urine 1.031 (*)    Ketones, ur 80 (*)    Protein, ur 30 (*)    Bacteria, UA RARE (*)    All other components within normal limits  URINE CULTURE    EKG None  Radiology No results found.  Procedures Procedures   Medications Ordered in ED Medications  promethazine (PHENERGAN) 25 mg in sodium chloride 0.9 % 50 mL IVPB (0 mg Intravenous Stopped 12/21/20 1238)  sodium chloride 0.9 % bolus 1,000 mL (1,000 mLs Intravenous New Bag/Given 12/21/20 1131)  alum & mag hydroxide-simeth (MAALOX/MYLANTA) 200-200-20 MG/5ML suspension 30 mL (30 mLs Oral Given 12/21/20 1133)    And  lidocaine (XYLOCAINE) 2 % viscous mouth solution 15 mL (15 mLs Oral Given 12/21/20 1133)  hydrOXYzine (VISTARIL) injection 25 mg (25 mg Intramuscular Given 12/21/20 1131)    ED Course  I have reviewed the triage vital signs and the nursing notes.  Pertinent labs & imaging results that were available during my care of the patient were reviewed by me and considered in my medical decision making (see chart for details).    MDM  Rules/Calculators/A&P                         Initial impression-patient presents with uncontrolled nausea and vomiting.  She is alert, does not appear in acute distress, vital signs reassuring.  Will obtain basic lab work-up, UA, hCG, provide patient fluids, antiemetics, GI cocktail and reassess.  Work-up-CBC shows slight leukocytosis 11.4, CMP shows slight decrease in CO2, increased T bili 1.5.  UA shows negative nitrites or leukocytes, no red blood cells or white blood cells, rare bacteria.  Reassessment patient was reassessed after fluids, antiemetics, GI cocktail states she feels on her times better, has no complaints at this time.  She states all pain has dissipated at this time.  Patient is agreeable for discharge.   Rule out-low suspicion for systemic infection as patient is nontoxic-appearing, vital signs reassuring.  She does have known leukocytosis 11.4 but I suspect this more of acute phase reactant she has been nausea and vomiting.  Low suspicion for liver or gallbladder abnormality as patient has no right upper quadrant pain, liver enzymes alk phos are within normal limits.  Patient does have slightly elevated T bili but this is nonspecific, there is no jaundice present my exam, might be secondary due to pregnancy.  will have repeat at OB/GYN.  Low suspicion for ruptured stomach ulcer as abdomen is nondistended, no peritoneal signs on my exam.  Low suspicion for UTI, pyelonephritis, kidney stone as UA is negative for signs infection, no CVA tenderness, no hematuria present.  Low suspicion for spontaneous abortion as patient denies vaginal bleeding, pelvic pain  Plan-  1.  Nausea and vomiting since resolved-suspect secondary due to  pregnancy, will recommend she continues with the suppository Phenergan, bland diet, follow-up with OB/GYN for further evaluation.    Vital signs have remained stable, no indication for hospital admission.   Patient given at home care as well strict return  precautions.  Patient verbalized that they understood agreed to said plan.   Final Clinical Impression(s) / ED Diagnoses Final diagnoses:  Non-intractable vomiting with nausea, unspecified vomiting type    Rx / DC Orders ED Discharge Orders    None       Marcello Fennel, PA-C 12/21/20 1328    Milton Ferguson, MD 12/22/20 0730

## 2020-12-21 NOTE — ED Notes (Signed)
Unable to auscultate fetal heart tones.  Provider aware.

## 2020-12-21 NOTE — ED Triage Notes (Addendum)
Pt c/o vomiting, abdominal soreness "from vomiting" per pt, decreased urination x 5 days. Pt is [redacted] weeks pregnant, G1, and sees [facility]. Pt has been taking Phenergan suppositories but reports the medicine only works for about 3 hours then the vomiting starts again. Pt called her OB/GYN this morning who sent her to ED for IV fluids.   Pt also reports she has "bad anxiety" and has been off her Vistaril for 2 weeks since finding out she was pregnant. She reports her OB/GYN told her she could take the Vistaril but can't keep medicine down.

## 2020-12-22 ENCOUNTER — Telehealth: Payer: Self-pay | Admitting: *Deleted

## 2020-12-22 LAB — URINE CULTURE: Culture: NO GROWTH

## 2020-12-22 NOTE — Telephone Encounter (Signed)
I have reviewed all of her labs, they all look good.  There is no lab issue.  She needs to keep whatever appt she has or get plugged in with NOB/sono appt

## 2020-12-22 NOTE — Telephone Encounter (Signed)
Patient informed Dr Despina Hidden reviewed all of her labs, they all look good.  There is no lab issue at this time.  Advised to keep dating u/s as scheduled and let us know if she has further issues.

## 2020-12-22 NOTE — Telephone Encounter (Signed)
Pt seen at [facility] ED yesterday, they recommended f/u here in a couple days due to some of her lab results.  Treatment for hep C in December and finished in February.  Wasn't sure if this would affect lab results.    Please review ED notes & lab results & advise on scheduling needs.    Is scheduled for dating U/S 01/11/2021.  Please advise.

## 2020-12-27 ENCOUNTER — Encounter (HOSPITAL_COMMUNITY): Payer: Self-pay | Admitting: *Deleted

## 2020-12-27 ENCOUNTER — Other Ambulatory Visit: Payer: Self-pay

## 2020-12-27 ENCOUNTER — Telehealth: Payer: Self-pay | Admitting: Women's Health

## 2020-12-27 ENCOUNTER — Emergency Department (HOSPITAL_COMMUNITY)
Admission: EM | Admit: 2020-12-27 | Discharge: 2020-12-27 | Disposition: A | Discharge status: Home / Self Care | Payer: 59 | Attending: Emergency Medicine | Admitting: Emergency Medicine

## 2020-12-27 DIAGNOSIS — Z87891 Personal history of nicotine dependence: Secondary | ICD-10-CM | POA: Insufficient documentation

## 2020-12-27 DIAGNOSIS — R300 Dysuria: Secondary | ICD-10-CM | POA: Insufficient documentation

## 2020-12-27 DIAGNOSIS — J45909 Unspecified asthma, uncomplicated: Secondary | ICD-10-CM | POA: Insufficient documentation

## 2020-12-27 DIAGNOSIS — R1010 Upper abdominal pain, unspecified: Secondary | ICD-10-CM | POA: Insufficient documentation

## 2020-12-27 DIAGNOSIS — O219 Vomiting of pregnancy, unspecified: Secondary | ICD-10-CM | POA: Diagnosis present

## 2020-12-27 DIAGNOSIS — Z3A09 9 weeks gestation of pregnancy: Secondary | ICD-10-CM | POA: Diagnosis not present

## 2020-12-27 LAB — COMPREHENSIVE METABOLIC PANEL
ALT: 12 U/L (ref 0–44)
AST: 14 U/L — ABNORMAL LOW (ref 15–41)
Albumin: 3.8 g/dL (ref 3.5–5.0)
Alkaline Phosphatase: 44 U/L (ref 38–126)
Anion gap: 9 (ref 5–15)
BUN: 7 mg/dL (ref 6–20)
CO2: 22 mmol/L (ref 22–32)
Calcium: 8.3 mg/dL — ABNORMAL LOW (ref 8.9–10.3)
Chloride: 104 mmol/L (ref 98–111)
Creatinine, Ser: 0.48 mg/dL (ref 0.44–1.00)
GFR, Estimated: >60 mL/min (ref >60)
Glucose, Bld: 123 mg/dL — ABNORMAL HIGH (ref 70–99)
Potassium: 3 mmol/L — ABNORMAL LOW (ref 3.5–5.1)
Sodium: 135 mmol/L (ref 135–145)
Total Bilirubin: 1.1 mg/dL (ref 0.3–1.2)
Total Protein: 6.3 g/dL — ABNORMAL LOW (ref 6.5–8.1)

## 2020-12-27 LAB — CBC WITH DIFFERENTIAL/PLATELET
Abs Immature Granulocytes: 0.06 10*3/uL (ref 0.00–0.07)
Basophils Absolute: 0.1 10*3/uL (ref 0.0–0.1)
Basophils Relative: 1 %
Eosinophils Absolute: 0 10*3/uL (ref 0.0–0.5)
Eosinophils Relative: 0 %
HCT: 40.2 % (ref 36.0–46.0)
Hemoglobin: 13.8 g/dL (ref 12.0–15.0)
Immature Granulocytes: 0 %
Lymphocytes Relative: 18 %
Lymphs Abs: 2.4 10*3/uL (ref 0.7–4.0)
MCH: 31.2 pg (ref 26.0–34.0)
MCHC: 34.3 g/dL (ref 30.0–36.0)
MCV: 91 fL (ref 80.0–100.0)
Monocytes Absolute: 1.1 10*3/uL — ABNORMAL HIGH (ref 0.1–1.0)
Monocytes Relative: 8 %
Neutro Abs: 10.1 10*3/uL — ABNORMAL HIGH (ref 1.7–7.7)
Neutrophils Relative %: 73 %
Platelets: 321 10*3/uL (ref 150–400)
RBC: 4.42 MIL/uL (ref 3.87–5.11)
RDW: 12.1 % (ref 11.5–15.5)
WBC: 13.8 10*3/uL — ABNORMAL HIGH (ref 4.0–10.5)
nRBC: 0 % (ref 0.0–0.2)

## 2020-12-27 LAB — URINALYSIS, ROUTINE W REFLEX MICROSCOPIC
Bilirubin Urine: NEGATIVE
Glucose, UA: NEGATIVE mg/dL
Hgb urine dipstick: NEGATIVE
Ketones, ur: >80 mg/dL — AB
Leukocytes,Ua: NEGATIVE
Nitrite: NEGATIVE
Protein, ur: NEGATIVE mg/dL
Specific Gravity, Urine: 1.02 (ref 1.005–1.030)
pH: 7.5 (ref 5.0–8.0)

## 2020-12-27 LAB — LIPASE, BLOOD: Lipase: 32 U/L (ref 11–51)

## 2020-12-27 MED ORDER — POTASSIUM CHLORIDE CRYS ER 20 MEQ PO TBCR: 20.0000 meq | EXTENDED_RELEASE_TABLET | Freq: Every day | ORAL | 0 refills | Status: DC

## 2020-12-27 MED ORDER — SODIUM CHLORIDE 0.9 % IV BOLUS
1000.0000 mL | Freq: Once | INTRAVENOUS | Status: AC
  Administered 2020-12-27: 1000 mL via INTRAVENOUS

## 2020-12-27 MED ORDER — ONDANSETRON HCL 4 MG/2ML IJ SOLN
4.0000 mg | Freq: Once | INTRAMUSCULAR | Status: AC
  Administered 2020-12-27: 4 mg via INTRAVENOUS
  Filled 2020-12-27: qty 2

## 2020-12-27 MED ORDER — POTASSIUM CHLORIDE CRYS ER 20 MEQ PO TBCR
20.0000 meq | EXTENDED_RELEASE_TABLET | Freq: Once | ORAL | Status: AC
  Administered 2020-12-27: 20 meq via ORAL
  Filled 2020-12-27: qty 1

## 2020-12-27 MED ORDER — ONDANSETRON 4 MG PO TBDP: 4.0000 mg | ORAL_TABLET | Freq: Three times a day (TID) | ORAL | 0 refills | Status: DC | PRN

## 2020-12-27 NOTE — ED Triage Notes (Signed)
States she is pregnant and continues to vomit

## 2020-12-27 NOTE — ED Notes (Signed)
Pt reports significant improvement in nausea at this time.

## 2020-12-27 NOTE — Telephone Encounter (Signed)
Patient states she is still unable to keep any liquids and food down despite taking Phenergan suppository.  Feels very thirsty and only urinating small amounts.  Advised to go to the ER for assessment and fluids. Pt verbalized understanding.

## 2020-12-27 NOTE — Telephone Encounter (Signed)
Pt states she's very nauseous, fatigued, lots of dry heaving, only able to keep down minimal water & wonton soup Anything else makes her vomit  Using promethazine (PHENERGAN) 25 MG suppository & gets relief at night but not during the day States her PCP gave her doxylamine-pyridaxine that gave minimal relief - usually will vomit immediately after taking this  Wonders if she should go back to ED   Please advise & call pt (scheduled for dating U/S 01/11/21)     [facility]

## 2020-12-27 NOTE — ED Notes (Signed)
Pt has significant crackles. States "I always sound like this now." Reports chronic resting dyspnea, not worse now than normal. Speaking in full sentences. [place] 2L at home and now. HOB raised.

## 2020-12-27 NOTE — ED Provider Notes (Signed)
[facility] EMERGENCY DEPARTMENT Provider Note   CSN: [id number] Arrival date & time: 12/27/20  1734     History Chief Complaint  Patient presents with  . Nausea    Emily Barajas is a 25 y.o. female.  HPI Patient is a 25 year old G1 female who is about [redacted] weeks pregnant who presents to the emergency department due to intractable nausea and vomiting.  Patient states she has been using promethazine suppositories as well as doxylamine.  pyridaxine with minimal relief.  She states that when she wakes in the morning she will sometimes vomit nearly 10 times. NBNB.  She states that she can tolerate small amounts of water and soup but otherwise cannot handle any other significant p.o. intake.  She will intermittently have upper abdominal pain.  She denies any pelvic pain or lower abdominal pain.  She reports intermittent dysuria.  She states she has first U/S scheduled for 6/1.     Past Medical History:  Diagnosis Date  . Anxiety   . Asthma   . Headache   . Hepatitis    c-pt reports she has received treatment  . HSV infection   . Seizures (HCC)    Pt does not remember when last one was, states "it's been awhile"    Patient Active Problem List   Diagnosis Date Noted  . Hepatitis C 05/19/2020  . History of substance abuse (HCC) 05/19/2020  . 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 No pertinent surgical history.   OB History    Gravida  1   Para  0   Term  0   Preterm  0   AB  0   Living  0     SAB  0   IAB  0   Ectopic  0   Multiple  0   Live Births  0           Family History  Problem Relation Age of Onset  . Cancer Paternal Grandfather   . Other Paternal Grandfather        on dialysis  . Heart attack Maternal Grandfather   . Bipolar disorder Father   . Schizophrenia Father   . Alcohol abuse Father   . Stroke Mother   . Polycystic ovary syndrome Sister   . Depression Sister   . Anxiety disorder Sister      Social History   Tobacco Use  . Smoking status: Former Smoker    Packs/day: 1.00    Types: Cigarettes  . Smokeless tobacco: Former Neurosurgeon    Types: Engineer, drilling  . Vaping Use: Every day  Substance Use Topics  . Alcohol use: Not Currently    Comment: occas  . Drug use: Not Currently    Frequency: 7.0 times per week    Types: Amphetamines, Marijuana    Comment: cocaine, meth, heroin, oxy, xanax, morphine 30 months since last use    Home Medications Prior to Admission medications   Medication Sig Start Date End Date Taking? Authorizing Provider  albuterol (VENTOLIN HFA) 108 (90 Base) MCG/ACT inhaler Inhale 1-2 puffs into the lungs every 6 (six) hours as needed. 02/24/20  Yes [provider]  Doxylamine-Pyridoxine 10-10 MG TBEC Take by mouth every 6 (six) hours as needed. 12/20/20  Yes [provider]  hydrOXYzine (VISTARIL) 50 MG capsule Take 50 mg by mouth 3 (three) times daily. 07/01/20  Yes [provider]  promethazine (PHENERGAN) 25 MG suppository Place 1 suppository (25 mg  total) rectally every 6 (six) hours as needed for nausea or vomiting. 12/20/20  Yes Cyril Mourning A, NP  valACYclovir (VALTREX) 500 MG tablet Take 500 mg by mouth daily. 10/13/20  Yes [provider]  venlafaxine XR (EFFEXOR-XR) 37.5 MG 24 hr capsule Take 37.5 mg by mouth every morning. 11/22/20  Yes [provider]    Allergies    Amoxicillin and Penicillin g  Review of Systems   Review of Systems  All other systems reviewed and are negative. Ten systems reviewed and are negative for acute change, except as noted in the HPI.    Physical Exam Updated Vital Signs BP 130/82 (BP Location: Right Arm)   Pulse 86   Temp 98.2 F (36.8 C) (Oral)   Resp 16   Wt 59.4 kg   LMP 10/25/2020   SpO2 100%   BMI 24.15 kg/m   Physical Exam Vitals and nursing note reviewed.  Constitutional:      General: She is not in acute distress.    Appearance: Normal  appearance. She is not ill-appearing, toxic-appearing or diaphoretic.  HENT:     Head: Normocephalic and atraumatic.     Right Ear: External ear normal.     Left Ear: External ear normal.     Nose: Nose normal.     Mouth/Throat:     Mouth: Mucous membranes are moist.     Pharynx: Oropharynx is clear. No oropharyngeal exudate or posterior oropharyngeal erythema.  Eyes:     Extraocular Movements: Extraocular movements intact.  Cardiovascular:     Rate and Rhythm: Normal rate and regular rhythm.     Pulses: Normal pulses.     Heart sounds: Normal heart sounds. No murmur heard. No friction rub. No gallop.   Pulmonary:     Effort: Pulmonary effort is normal. No respiratory distress.     Breath sounds: Normal breath sounds. No stridor. No wheezing, rhonchi or rales.  Abdominal:     General: Abdomen is flat.     Palpations: Abdomen is soft.     Tenderness: There is abdominal tenderness.     Comments: Abdomen is flat and soft.  Very mild tenderness noted along the epigastrium.  No lower abdominal pain.  No pelvic pain.  Musculoskeletal:        General: Normal range of motion.     Cervical back: Normal range of motion and neck supple. No tenderness.  Skin:    General: Skin is warm and dry.  Neurological:     General: No focal deficit present.     Mental Status: She is alert and oriented to person, place, and time.  Psychiatric:        Mood and Affect: Mood normal.        Behavior: Behavior normal.    ED Results / Procedures / Treatments   Labs (all labs ordered are listed, but only abnormal results are displayed) Labs Reviewed  CBC WITH DIFFERENTIAL/PLATELET  COMPREHENSIVE METABOLIC PANEL  URINALYSIS, ROUTINE W REFLEX MICROSCOPIC  LIPASE, BLOOD    EKG None  Radiology No results found.  Procedures Procedures   Medications Ordered in ED Medications  sodium chloride 0.9 % bolus 1,000 mL (1,000 mLs Intravenous New Bag/Given 12/27/20 1936)  ondansetron (ZOFRAN) injection 4  mg (4 mg Intravenous Given 12/27/20 1930)    ED Course  I have reviewed the triage vital signs and the nursing notes.  Pertinent labs & imaging results that were available during my care of the patient were reviewed  by me and considered in my medical decision making (see chart for details).    MDM Rules/Calculators/A&P                          Pt is a 25 y.o. female who presents to the emergency department due to intractable nausea and vomiting during pregnancy.  Labs: CBC pending.  CMP pending.  Lipase pending.  UA pending.   I, Placido Sou, PA-C, personally reviewed and evaluated these images and lab results as part of my medical decision-making.  Patient given a dose of Zofran as well as a liter of IV fluids.  In my shift and patient care is being transferred to [facility]  Patient pending lab work as well as p.o. challenge.  If reassuring she can be discharged home with OB/GYN follow-up.  She has promethazine suppositories that she can continue to take as needed in addition to her doxylamine pyridaxine.   Note: Portions of this report may have been transcribed using voice recognition software. Every effort was made to ensure accuracy; however, inadvertent computerized transcription errors may be present.   Final Clinical Impression(s) / ED Diagnoses Final diagnoses:  Nausea/vomiting in pregnancy   Rx / DC Orders ED Discharge Orders    None       Placido Sou, PA-C 12/27/20 2043    Mancel Bale, MD 12/28/20 [phone]

## 2020-12-27 NOTE — ED Notes (Signed)
Unable to obtain adequate blood for labs with IV. Phlebotomy contacted.

## 2020-12-27 NOTE — Discharge Instructions (Addendum)
You were seen in the ER for your nausea and vomiting in the context of your pregnancy.  You were administered IV fluid resuscitation as well as medication called Zofran for your nausea.  You were found to be low your potassium, you were administered a dose in the ER and prescribed a dose at home for the next 3 days.  Additionally been given a prescription for as needed Zofran at home for your severe nausea.  Please do not take your Phenergan suppositories in addition to Zofran.  Please call your OB/GYN tomorrow for follow-up later this week or early next week for reevaluation.  Please increase your hydration, and return to the emergency department if you develop any nausea or vomiting that does not stop, abdominal pain, vaginal bleeding or discharge, or any other new severe symptoms.

## 2020-12-28 NOTE — ED Provider Notes (Signed)
  Physical Exam  BP 107/69   Pulse 68   Temp 98.2 F (36.8 C) (Oral)   Resp 18   Wt 59.4 kg   LMP 10/25/2020   SpO2 100%   BMI 24.15 kg/m   Physical Exam  ED Course/Procedures     Procedures  MDM   I assumed care of this patient from preceding provider, Placido Sou, PA-C at time of shift change.  Please see his associated note for further signed the patient's ED course.  In brief patient is approximately [redacted] weeks pregnant and experiencing intractable nausea and vomiting. Not relieved with doxylamine-pyridoxine and Phenergan suppositories at home.  Patient presented to the ED today prompting of her OB/GYN for evaluation of hydration status and management of intractable nausea and vomiting.  Patient has received IV fluid bolus and zofran. At time of shift change she is awaiting labs and PO challenge.  Patient reevaluated after ministration of Zofran with significant provement in her nausea.  She endorses total resolution at this time. Laboratory studies are also reassuring.  CBC with mild leukocytosis of 13.8, likely reactive secondary to vomiting.  CMP with hypokalemia of 3.0.  Oral repletion administered in the ED.  No further work-up warranted at this time given significant improvement in patient symptoms.  Recommend close OB/GYN follow-up for management of possible hyperemesis gravidarum.  Discharged with prescription for Zofran and oral potassium repletion.  [place] voiced understanding of her medical evaluation and treatment plan.  Each of her questions was answered to her expressed satisfaction.  Strict return precautions given.  Patient is well-appearing, stable, and appropriate for discharge at this time.  This chart was dictated using voice recognition software, Dragon. Despite the best efforts of this provider to proofread and correct errors, errors may still occur which can change documentation meaning.       Paris Lore, PA-C 12/28/20 0004    Mancel Bale, MD 12/28/20 [phone]

## 2020-12-29 ENCOUNTER — Ambulatory Visit (INDEPENDENT_AMBULATORY_CARE_PROVIDER_SITE_OTHER): Payer: 59 | Admitting: Advanced Practice Midwife

## 2020-12-29 ENCOUNTER — Other Ambulatory Visit: Payer: Self-pay

## 2020-12-29 ENCOUNTER — Encounter: Payer: Self-pay | Admitting: Advanced Practice Midwife

## 2020-12-29 VITALS — BP 123/79 | HR 81 | Wt 135.0 lb

## 2020-12-29 DIAGNOSIS — Z3A09 9 weeks gestation of pregnancy: Secondary | ICD-10-CM

## 2020-12-29 DIAGNOSIS — O219 Vomiting of pregnancy, unspecified: Secondary | ICD-10-CM | POA: Diagnosis not present

## 2020-12-29 NOTE — Progress Notes (Signed)
[facility] Visit  Patient name: Emily Barajas MRN [id number]  Date of birth: Dec 29, 1995  CC & HPI:  Emily Barajas is a 25 y.o.  female presenting today for follow up from ED visit 2 days ago (vomiting).   Pertinent History Reviewed:  Medical & Surgical Hx:   Past Medical History:  Diagnosis Date  . Anxiety   . Asthma   . Headache   . Hepatitis    c-pt reports she has received treatment  . HSV infection   . Seizures (HCC)    Pt does not remember when last one was, states "it's been awhile"   History reviewed. No pertinent surgical history. Family History  Problem Relation Age of Onset  . Cancer Paternal Grandfather   . Other Paternal Grandfather        on dialysis  . Heart attack Maternal Grandfather   . Bipolar disorder Father   . Schizophrenia Father   . Alcohol abuse Father   . Stroke Mother   . Polycystic ovary syndrome Sister   . Depression Sister   . Anxiety disorder Sister     Current Outpatient Medications:  .  hydrOXYzine (VISTARIL) 50 MG capsule, Take 50 mg by mouth 3 (three) times daily., Disp: , Rfl:  .  ondansetron (ZOFRAN ODT) 4 MG disintegrating tablet, Take 1 tablet (4 mg total) by mouth every 8 (eight) hours as needed for nausea or vomiting., Disp: 20 tablet, Rfl: 0 .  potassium chloride SA (KLOR-CON) 20 MEQ tablet, Take 1 tablet (20 mEq total) by mouth daily for 3 doses., Disp: 3 tablet, Rfl: 0 .  valACYclovir (VALTREX) 500 MG tablet, Take 500 mg by mouth daily., Disp: , Rfl:  .  venlafaxine XR (EFFEXOR-XR) 37.5 MG 24 hr capsule, Take 37.5 mg by mouth every morning., Disp: , Rfl:  .  albuterol (VENTOLIN HFA) 108 (90 Base) MCG/ACT inhaler, Inhale 1-2 puffs into the lungs every 6 (six) hours as needed. (Patient not taking: Reported on 12/29/2020), Disp: , Rfl:  .  Doxylamine-Pyridoxine 10-10 MG TBEC, Take by mouth every 6 (six) hours as needed. (Patient not taking: Reported on 12/29/2020), Disp: , Rfl:  .  promethazine (PHENERGAN) 25 MG  suppository, Place 1 suppository (25 mg total) rectally every 6 (six) hours as needed for nausea or vomiting. (Patient not taking: Reported on 12/29/2020), Disp: 20 each, Rfl: 1 Social History: Reviewed -  reports that she has quit smoking. Her smoking use included cigarettes. She smoked 1.00 pack per day. She has quit using smokeless tobacco.  Her smokeless tobacco use included chew.  Review of Systems:   Constitutional: Negative for fever and chills Eyes: Negative for visual disturbances Respiratory: Negative for shortness of breath, dyspnea Cardiovascular: Negative for chest pain or palpitations  Gastrointestinal: Negative for diarrhea and constipation; no abdominal pain.  Nausea and vomiting is much better now that she is using zofran.  Diclegis and phenergan worked earlier in pg, not now.  Has gained >5 lbs in 2 days Genitourinary: Negative for dysuria and urgency, vaginal irritation or itching Musculoskeletal: Negative for back pain, joint pain, myalgias  Neurological: Negative for dizziness and headaches    Objective Findings:    Physical Examination: Vitals:   12/29/20 1424  BP: 123/79  Pulse: 81   General appearance - well appearing, and in no distress Mental status - alert, oriented to person, place, and time Chest:  Normal respiratory effort Heart - normal rate and regular rhythm Abdomen:  Soft, nontender Musculoskeletal:  Normal  range of motion without pain Extremities:  No edema    No results found for this or any previous visit (from the past 24 hour(s)).    Assessment & Plan:  A:   N&V 9 weeks pg, feels so much better P:  Continue medication regimen   Return for add New ob on June 1 appt if possible.  Jacklyn Shell CNM 12/29/2020 3:01 PM

## 2021-01-05 ENCOUNTER — Other Ambulatory Visit: Payer: Self-pay | Admitting: Adult Health

## 2021-01-10 ENCOUNTER — Other Ambulatory Visit: Payer: Self-pay | Admitting: Obstetrics & Gynecology

## 2021-01-10 DIAGNOSIS — O3680X0 Pregnancy with inconclusive fetal viability, not applicable or unspecified: Secondary | ICD-10-CM

## 2021-01-11 ENCOUNTER — Other Ambulatory Visit: Payer: 59

## 2021-01-13 ENCOUNTER — Telehealth: Payer: Self-pay | Admitting: Women's Health

## 2021-01-13 MED ORDER — ONDANSETRON 4 MG PO TBDP: ORAL_TABLET | ORAL | 1 refills | Status: DC

## 2021-01-13 NOTE — Telephone Encounter (Signed)
Pt is changing to a new pharmacy Please send refills of ondansetron (ZOFRAN-ODT) 4 MG disintegrating tablet       [facility]

## 2021-01-13 NOTE — Telephone Encounter (Signed)
Refilled zofran

## 2021-02-10 ENCOUNTER — Ambulatory Visit (INDEPENDENT_AMBULATORY_CARE_PROVIDER_SITE_OTHER): Payer: 59

## 2021-02-10 ENCOUNTER — Other Ambulatory Visit: Payer: Self-pay

## 2021-02-10 ENCOUNTER — Other Ambulatory Visit: Payer: Self-pay | Admitting: Obstetrics & Gynecology

## 2021-02-10 ENCOUNTER — Other Ambulatory Visit: Payer: 59

## 2021-02-10 DIAGNOSIS — O3680X0 Pregnancy with inconclusive fetal viability, not applicable or unspecified: Secondary | ICD-10-CM | POA: Diagnosis not present

## 2021-02-10 DIAGNOSIS — Z3A13 13 weeks gestation of pregnancy: Secondary | ICD-10-CM | POA: Diagnosis not present

## 2021-02-10 DIAGNOSIS — Z3682 Encounter for antenatal screening for nuchal translucency: Secondary | ICD-10-CM

## 2021-02-10 DIAGNOSIS — Z3401 Encounter for supervision of normal first pregnancy, first trimester: Secondary | ICD-10-CM

## 2021-02-10 DIAGNOSIS — Z3400 Encounter for supervision of normal first pregnancy, unspecified trimester: Secondary | ICD-10-CM | POA: Insufficient documentation

## 2021-02-10 NOTE — Progress Notes (Signed)
[place] 13+6 wks,crl 80.86 mm,NB present,NT 1.8 mm,normal ovaries,posterior placenta,fhr 147 bpm

## 2021-02-15 LAB — CBC/D/PLT+RPR+RH+ABO+RUBIGG...
Antibody Screen: NEGATIVE
Basophils Absolute: 0.1 10*3/uL (ref 0.0–0.2)
Basos: 1 %
EOS (ABSOLUTE): 0.1 10*3/uL (ref 0.0–0.4)
Eos: 1 %
HCV Ab: >11 s/co ratio — ABNORMAL HIGH (ref 0.0–0.9)
HIV Screen 4th Generation wRfx: NONREACTIVE
Hematocrit: 40 % (ref 34.0–46.6)
Hemoglobin: 13.6 g/dL (ref 11.1–15.9)
Hepatitis B Surface Ag: NEGATIVE
Immature Grans (Abs): 0.1 10*3/uL (ref 0.0–0.1)
Immature Granulocytes: 1 %
Lymphocytes Absolute: 2.5 10*3/uL (ref 0.7–3.1)
Lymphs: 21 %
MCH: 30.9 pg (ref 26.6–33.0)
MCHC: 34 g/dL (ref 31.5–35.7)
MCV: 91 fL (ref 79–97)
Monocytes Absolute: 0.7 10*3/uL (ref 0.1–0.9)
Monocytes: 6 %
Neutrophils Absolute: 8.4 10*3/uL — ABNORMAL HIGH (ref 1.4–7.0)
Neutrophils: 70 %
Platelets: 372 10*3/uL (ref 150–450)
RBC: 4.4 x10E6/uL (ref 3.77–5.28)
RDW: 12.5 % (ref 11.7–15.4)
RPR Ser Ql: NONREACTIVE
Rh Factor: NEGATIVE
Rubella Antibodies, IGG: 1 index (ref >0.99)
WBC: 11.9 10*3/uL — ABNORMAL HIGH (ref 3.4–10.8)

## 2021-02-15 LAB — INTEGRATED 1
Crown Rump Length: 80.9 mm
Gest. Age on Collection Date: 13.7 weeks
Maternal Age at EDD: 24.7 yr
Nuchal Translucency (NT): 1.8 mm
Number of Fetuses: 1
PAPP-A Value: 1414.2 ng/mL
Weight: 142 [lb_av]

## 2021-02-15 LAB — HCV RT-PCR, QUANT (NON-GRAPH): Hepatitis C Quantitation: NOT DETECTED IU/mL

## 2021-03-10 ENCOUNTER — Other Ambulatory Visit (HOSPITAL_COMMUNITY)
Admission: RE | Admit: 2021-03-10 | Discharge: 2021-03-10 | Disposition: A | Discharge status: Home / Self Care | Payer: 59 | Source: Ambulatory Visit | Attending: Obstetrics and Gynecology | Admitting: Obstetrics and Gynecology

## 2021-03-10 ENCOUNTER — Encounter: Payer: Self-pay | Admitting: Obstetrics and Gynecology

## 2021-03-10 ENCOUNTER — Other Ambulatory Visit: Payer: Self-pay

## 2021-03-10 ENCOUNTER — Ambulatory Visit (INDEPENDENT_AMBULATORY_CARE_PROVIDER_SITE_OTHER): Payer: 59 | Admitting: Obstetrics and Gynecology

## 2021-03-10 ENCOUNTER — Ambulatory Visit: Payer: 59 | Admitting: *Deleted

## 2021-03-10 VITALS — BP 127/82 | HR 68 | Wt 143.0 lb

## 2021-03-10 DIAGNOSIS — F419 Anxiety disorder, unspecified: Secondary | ICD-10-CM | POA: Insufficient documentation

## 2021-03-10 DIAGNOSIS — Z3402 Encounter for supervision of normal first pregnancy, second trimester: Secondary | ICD-10-CM

## 2021-03-10 DIAGNOSIS — Z124 Encounter for screening for malignant neoplasm of cervix: Secondary | ICD-10-CM

## 2021-03-10 DIAGNOSIS — Z113 Encounter for screening for infections with a predominantly sexual mode of transmission: Secondary | ICD-10-CM

## 2021-03-10 DIAGNOSIS — B182 Chronic viral hepatitis C: Secondary | ICD-10-CM

## 2021-03-10 DIAGNOSIS — Z3A19 19 weeks gestation of pregnancy: Secondary | ICD-10-CM | POA: Diagnosis present

## 2021-03-10 DIAGNOSIS — O26899 Other specified pregnancy related conditions, unspecified trimester: Secondary | ICD-10-CM

## 2021-03-10 DIAGNOSIS — Z8619 Personal history of other infectious and parasitic diseases: Secondary | ICD-10-CM

## 2021-03-10 DIAGNOSIS — Z6791 Unspecified blood type, Rh negative: Secondary | ICD-10-CM

## 2021-03-10 LAB — POCT URINALYSIS DIPSTICK OB
Blood, UA: NEGATIVE
Glucose, UA: NEGATIVE
Ketones, UA: NEGATIVE
Leukocytes, UA: NEGATIVE
Nitrite, UA: NEGATIVE
POC,PROTEIN,UA: NEGATIVE

## 2021-03-10 MED ORDER — BLOOD PRESSURE MONITOR MISC: 0 refills | Status: AC

## 2021-03-10 MED ORDER — ONDANSETRON 4 MG PO TBDP: 4.0000 mg | ORAL_TABLET | Freq: Four times a day (QID) | ORAL | 1 refills | Status: DC | PRN

## 2021-03-10 NOTE — Patient Instructions (Signed)

## 2021-03-10 NOTE — Progress Notes (Signed)
Subjective:  Adilenne Ashworth is a 25 y.o. G1P0000 at [redacted]w[redacted]d being seen today for first OB visit. EDD by U/S. H/O Hep C, HSV and anxiety.    She is currently monitored for the following issues for this high-risk pregnancy and has Substance-induced psychotic disorder (HCC); Hepatitis C; History of substance abuse (HCC); Supervision of normal first pregnancy; Rh negative state in antepartum period; History of ELISA positive for HSV; Anxiety; and Genital herpes simplex on their problem list.  Patient reports no complaints.  Contractions: Not present. Vag. Bleeding: None.  Movement: Present. Denies leaking of fluid.   The following portions of the patient's history were reviewed and updated as appropriate: allergies, current medications, past family history, past medical history, past social history, past surgical history and problem list. Problem list updated.  Objective:   Vitals:   03/10/21 0952  BP: 127/82  Pulse: 68  Weight: 143 lb (64.9 kg)    Fetal Status:     Movement: Present     General:  Alert, oriented and cooperative. Patient is in no acute distress.  Skin: Skin is warm and dry. No rash noted.   Cardiovascular: Normal heart rate noted  Respiratory: Normal respiratory effort, no problems with respiration noted  Abdomen: Soft, gravid, appropriate for gestational age. Pain/Pressure: Absent     Pelvic:  closed, pap smear obtained      Extremities: Normal range of motion.  Edema: None  Mental Status: Normal mood and affect. Normal behavior. Normal judgment and thought content.   Urinalysis:      Assessment and Plan:  Pregnancy: G1P0000 at [redacted]w[redacted]d  1. Encounter for supervision of normal first pregnancy in second trimester Prenatal care and labs reviewed with pt Genetic testing discussed Zofran for N/V - Urine Culture - Pain Management Screening Profile (10S) - Cytology - PAP - Blood Pressure Monitor MISC; For regular home bp monitoring during pregnancy  Dispense: 1 each;  Refill: 0 - [facility] AMB BABYSCRIPTS SCHEDULE OPTIMIZATION - POC Urinalysis Dipstick OB - INTEGRATED 2  2. Chronic hepatitis C without hepatic coma (HCC) HCV quant negative, repeat in 3 months   3. Rh negative state in antepartum period Rho gam as indicated  4. History of ELISA positive for HSV Currently on suppression, will continue  5. Anxiety Stable on current meds Reviewed with pt Continue with Tx as per PCP  6. [redacted] weeks gestation of pregnancy  - Urine Culture - Pain Management Screening Profile (10S) - Cytology - PAP - Blood Pressure Monitor MISC; For regular home bp monitoring during pregnancy  Dispense: 1 each; Refill: 0 - [facility] AMB BABYSCRIPTS SCHEDULE OPTIMIZATION - POC Urinalysis Dipstick OB - INTEGRATED 2  7. Screening for cervical cancer  - Cytology - PAP  8. Screening for STD (sexually transmitted disease)  - Cytology - PAP  Preterm labor symptoms and general obstetric precautions including but not limited to vaginal bleeding, contractions, leaking of fluid and fetal movement were reviewed in detail with the patient. Please refer to After Visit Summary for other counseling recommendations.  Return in about 4 weeks (around 04/07/2021) for OB visit, face to face, any provider.   Hermina Staggers, MD

## 2021-03-11 LAB — PMP SCREEN PROFILE (10S), URINE
Amphetamine Scrn, Ur: NEGATIVE ng/mL
BARBITURATE SCREEN URINE: NEGATIVE ng/mL
BENZODIAZEPINE SCREEN, URINE: NEGATIVE ng/mL
CANNABINOIDS UR QL SCN: POSITIVE ng/mL — AB
Cocaine (Metab) Scrn, Ur: NEGATIVE ng/mL
Creatinine(Crt), U: 105.1 mg/dL (ref 20.0–300.0)
Methadone Screen, Urine: NEGATIVE ng/mL
OXYCODONE+OXYMORPHONE UR QL SCN: NEGATIVE ng/mL
Opiate Scrn, Ur: NEGATIVE ng/mL
Ph of Urine: 6.8 (ref 4.5–8.9)
Phencyclidine Qn, Ur: NEGATIVE ng/mL
Propoxyphene Scrn, Ur: NEGATIVE ng/mL

## 2021-03-12 LAB — URINE CULTURE

## 2021-03-14 ENCOUNTER — Other Ambulatory Visit: Payer: Self-pay | Admitting: Obstetrics & Gynecology

## 2021-03-14 DIAGNOSIS — Z363 Encounter for antenatal screening for malformations: Secondary | ICD-10-CM

## 2021-03-15 ENCOUNTER — Telehealth: Payer: Self-pay

## 2021-03-15 NOTE — Telephone Encounter (Signed)
Called pt per Dr Alysia Penna to relay pap smear results. Sent pt a text to help her setup Mychart. Pt confirmed understanding.

## 2021-03-16 ENCOUNTER — Other Ambulatory Visit: Payer: Self-pay

## 2021-03-16 ENCOUNTER — Ambulatory Visit (INDEPENDENT_AMBULATORY_CARE_PROVIDER_SITE_OTHER): Payer: 59

## 2021-03-16 DIAGNOSIS — Z3A18 18 weeks gestation of pregnancy: Secondary | ICD-10-CM | POA: Diagnosis not present

## 2021-03-16 DIAGNOSIS — Z363 Encounter for antenatal screening for malformations: Secondary | ICD-10-CM

## 2021-03-16 NOTE — Progress Notes (Signed)
[place] 18+5 wks,cephalic,LVEICF 2.3 mm,fhr 144 bpm,normal ovaries,cx 3.2 cm,posterior fundal gr 0,svp of fluid 3.7 cm,EFW 258 G 49%,anatomy complete

## 2021-03-21 LAB — CYTOLOGY - PAP
Chlamydia: NEGATIVE
Comment: NEGATIVE
Comment: NEGATIVE
Comment: NEGATIVE
Comment: NORMAL
Diagnosis: UNDETERMINED — AB
HPV 16: NEGATIVE
HPV 18 / 45: NEGATIVE
High risk HPV: POSITIVE — AB
Neisseria Gonorrhea: NEGATIVE

## 2021-03-22 ENCOUNTER — Other Ambulatory Visit: Payer: Self-pay | Admitting: Obstetrics and Gynecology

## 2021-04-06 ENCOUNTER — Other Ambulatory Visit: Payer: Self-pay | Admitting: Obstetrics

## 2021-04-07 ENCOUNTER — Ambulatory Visit (INDEPENDENT_AMBULATORY_CARE_PROVIDER_SITE_OTHER): Payer: 59 | Admitting: Medical

## 2021-04-07 ENCOUNTER — Other Ambulatory Visit: Payer: Self-pay

## 2021-04-07 ENCOUNTER — Encounter: Payer: Self-pay | Admitting: Medical

## 2021-04-07 VITALS — BP 136/86 | HR 85 | Wt 144.4 lb

## 2021-04-07 DIAGNOSIS — Z3A21 21 weeks gestation of pregnancy: Secondary | ICD-10-CM

## 2021-04-07 DIAGNOSIS — Z8619 Personal history of other infectious and parasitic diseases: Secondary | ICD-10-CM

## 2021-04-07 DIAGNOSIS — K5901 Slow transit constipation: Secondary | ICD-10-CM

## 2021-04-07 DIAGNOSIS — A6004 Herpesviral vulvovaginitis: Secondary | ICD-10-CM

## 2021-04-07 DIAGNOSIS — Z3402 Encounter for supervision of normal first pregnancy, second trimester: Secondary | ICD-10-CM

## 2021-04-07 DIAGNOSIS — B182 Chronic viral hepatitis C: Secondary | ICD-10-CM

## 2021-04-07 DIAGNOSIS — Z1379 Encounter for other screening for genetic and chromosomal anomalies: Secondary | ICD-10-CM

## 2021-04-07 DIAGNOSIS — O219 Vomiting of pregnancy, unspecified: Secondary | ICD-10-CM

## 2021-04-07 DIAGNOSIS — Z6791 Unspecified blood type, Rh negative: Secondary | ICD-10-CM

## 2021-04-07 DIAGNOSIS — F1911 Other psychoactive substance abuse, in remission: Secondary | ICD-10-CM

## 2021-04-07 DIAGNOSIS — O26899 Other specified pregnancy related conditions, unspecified trimester: Secondary | ICD-10-CM

## 2021-04-07 MED ORDER — DOCUSATE SODIUM 100 MG PO CAPS: 100.0000 mg | ORAL_CAPSULE | Freq: Two times a day (BID) | ORAL | 0 refills | Status: AC

## 2021-04-07 MED ORDER — PROMETHAZINE HCL 25 MG PO TABS: 25.0000 mg | ORAL_TABLET | Freq: Four times a day (QID) | ORAL | 1 refills | Status: AC | PRN

## 2021-04-07 MED ORDER — POLYETHYLENE GLYCOL 3350 17 GM/SCOOP PO POWD: 1.0000 | Freq: Once | ORAL | 0 refills | Status: AC

## 2021-04-07 NOTE — Patient Instructions (Signed)
AREA PEDIATRIC/FAMILY PRACTICE PHYSICIANS  [place] ([address]) [facility] Chambliss, MD; Eniola, MD; Hale, MD; Hensel, MD; McDiarmid, MD; McIntyer, MD; Neal, MD; Walden, MD [address] [place], [place] [address] [phone] Mon-Fri 8:30-12:30, 1:30-5:00 Providers come to see babies at [facility] Accepting Medicaid [facility] providers who accept newborns: Koirala, MD; Morrow, MD; Wolters, MD [address], [place], [place] [address] [phone] Mon-Fri 8:00-5:30 Babies seen by providers at [facility] Does NOT accept Medicaid Please call early in hospitalization for appointment (limited availability)  [facility] Mulberry, MD [address] [place], [place] [address] [phone] Mon, Tue, Thur, Fri 8:30-5:00, Wed 10:00-7:00 (closed 1-2pm) Babies seen by [facility] providers Accepting Medicaid Rubin - Pediatrician Rubin, MD [address], [place], [place] [address] [phone] Mon-Fri 8:30-5:00, Sat 8:30-12:00 Provider comes to see babies at [facility] Accepting Medicaid Must have been referred from current patients or contacted office prior to delivery [facility] ([facility]) Brown, MD; Chandler, MD; Ettefagh, MD; Grant, MD; Lester, MD; McCormick, MD; McQueen, MD; Prose, MD; Simha, MD; Stanley, MD; Stryffeler, NP; Tebben, NP [address], [place], [place] [address] [phone] Mon, Tue, Thur, Fri 8:30-5:30, Wed 9:30-5:30, Sat 8:30-12:30 Babies seen by [facility] providers Accepting Medicaid Only accepting infants of first-time parents or siblings of current patients Hospital discharge coordinator will make follow-up appointment Jack Amos [address], [place], [place]  [address] [phone]   Fax - [phone] [facility] [address], [place], [place]  [address] Phone - [phone]   Fax - [phone] Shilpa Gosrani [address], [place], [place]  [address] [phone]  [place] ([address])  [facility] Bates, MD; Brassfield, MD; Cooper, Cox, MD; MD; Davis, MD; Dovico, MD; Ettefaugh, MD; Little, MD; Lowe, MD; Keiffer, MD; Melvin, MD; Sumner, MD; Williams, MD [address], [place], [place] [address] [phone] Mon-Fri 8:30-5:00 (extended evenings Mon-Thur as needed), Sat-Sun 10:00-1:00 Providers come to see babies at [facility] Accepting Medicaid for families of first-time babies and families with all children in the household age 3 and under. Must register with office prior to making appointment (M-F only). [facility] Henson, NP; Knapp, MD; Lalonde, MD; Tysinger, PA [address] [place], [place] [address] [phone] Mon-Fri 8:00-5:00 Babies seen by providers at [facility] Does NOT accept Medicaid/Commercial Insurance Only [facility] ([facility])  Artis, MD; Barnes, MD; Bratton, MD; Coccaro, MD; Lockett Gardner, MD; Kramer, MD; Marshall, MD; Netherton, MD; Poleto, MD; Skinner, MD [address] [place], [place] [address] [phone] Mon-Fri 8:30-5:30, Sat (Oct.-Mar.) 9:00-1:00 Babies seen by providers at [facility] Accepting Medicaid  [place] ([address]) [facility] Reid, MD; Warner, MD [address], [place], [place] [address] [phone] Mon-Fri 8:30-5:00, Sat 8:30-12:00 Providers come to see babies at [facility] Does NOT accept Medicaid [facility] Becker, PA; Hagler, MD; Scifres, PA; Sun, MD; Swayne, MD [address], , [place] [address] [phone] Mon-Fri 8:00-5:00 Babies seen by providers at [facility] Does NOT accept Medicaid Only accepting babies of parents who  are patients Please call early in hospitalization for appointment (limited availability)  Pediatricians Clark, MD; Frye, MD; Kelleher, MD; Mack, NP; Miller, MD; O'Keller, MD; Patterson, NP; Pudlo, MD; Puzio, MD; Thomas, MD; Tucker, MD; Twiselton, MD [address], [place], [place] [address] [phone] Mon-Fri 8:00-5:00, Sat 9:00-12:00 Providers come to see babies at [facility] Does NOT accept Medicaid  [place] ([address]) [facility] providers accepting new patients: Brake, NP; Wharton, PA [address], [place], [place] [address] [phone] Mon-Fri 8:00-5:00 Babies seen by providers at [facility] Does NOT accept Medicaid Only accepting babies of parents who are patients Please call early in hospitalization for appointment (limited availability) [facility] Gay, MD; Quinlan, MD [address] [place], [place] [address] [phone] (press 1 to schedule appointment) Mon-Fri 8:00-5:00 Providers come to see babies at [facility] Does NOT accept Medicaid [facility] Mazer, MD [address] [place], [place] [address] [phone] Mon-Fri 8:30-5:00 (lunch 12:30-1:00), extended hours by appointment only Wed 5:00-6:30 Babies seen by [facility] providers Accepting Medicaid [facility] Banks, MD; Jordan, MD; Koberlein, MD [address], [place], [place] [address] [phone] Mon-Fri 8:00-5:00 Babies seen by [facility] providers Does NOT accept Medicaid [facility] Parker, MD; Hunter, MD; Wallace, DO [address] [place], [place] [address] [phone] Mon-Fri 8:00-5:00 Babies seen by [facility] providers Does NOT accept Medicaid [facility] Brandon, PA; Brecken, PA; Christy, NP; Dees, MD; DeClaire, MD; DeWeese, MD; Hansen, NP; Mills, NP; Parrish, NP; Smoot, NP; Summer, MD; Vapne,  MD [address] [place], [place] [address] [phone] Mon-Fri 8:30-5:00, Sat 10:00-1:00 Providers come to see babies at [facility] Does NOT accept Medicaid Free prenatal information session Tuesdays at 4:45pm [facility] Bouska, MD; Gordon, PA; Jeffery, PA; Weber, PA [address] [place] [address] [phone] Mon-Fri 7:30-5:30 Babies seen by [facility] providers [facility] [address], [place], [place]  [address] [phone]   Fax - [phone]  [place] ([address] & [address]) [facility] [place] [address] [place], [place] [address] [phone] Mon-Thur 8:00-6:00 Providers come to see babies at [facility] Accepting Medicaid [facility] Anderson, NP; Badger, MD; Beal, PA; Spencer, PA [address] [place], [place] [address] [phone] Mon-Thur 7:30-7:30, Fri 7:30-4:30 Babies seen by [facility] providers Accepting Medicaid [facility] Agbuya, MD; Klett, NP; Romgoolam, MD [address], [place], [place] [address] [phone] Mon-Fri 8:30-5:00, Sat 8:30-12:00 Providers come to see babies at [facility] Accepting Medicaid Must have "Meet & Greet" appointment at office prior to delivery [facility] McCord, MD; Wallace, MD; Wood, MD [address], [place], [place] [address] [phone] Mon-Wed 8:00-6:00, Thur-Fri 8:00-5:00, Sat 9:00-12:00 Providers come to see babies at [facility] Does NOT accept Medicaid Only accepting siblings of current patients [facility]  [address], [place], [place]  [address] [phone]   Fax - [phone] [facility] [address], [place], [place]  [address] [phone]   Fax -  [phone]  [place] ([address] & [address])  [facility] Cirigliano, DO; Matthews, DO [address] [place], [place] [address] [phone] Mon-Fri 7:00-5:00 Babies seen by [facility] providers Does NOT accept Medicaid [facility] Briscoe, MD; Howley, PA; Moreira, PA [address], [place], [place] [address] [phone] Mon-Fri 8:00-5:00 Babies seen by [facility] providers Accepting Medicaid [facility] Boyd, MD; Church, PA; Jones, NP; Osborn, PA [address], , [place] [address] [phone] Mon-Fri 8:00-5:00 Babies seen by providers at [facility] Accepting Medicaid  [place] ([address]) [facility] [place] Wendling, DO [address] [place], [place] [address] [phone] Mon-Fri 8:00-5:00 Babies seen by [facility] providers Does NOT accept Medicaid Limited availability, please call early in hospitalization to schedule follow-up [facility] Calderon, PA; Cummings, MD; Dillard, MD; Martin, PA; Olson, MD; VanDeven, PA [address], [place], [place] [address] [phone] Mon-Fri 8:30-5:00, Sat 9:00-12:00 Babies seen by providers at [facility] Accepting Medicaid Please register online then schedule online or call office [email or web address] Hunter, NP; Kumar, MD; Martin Rogers, PA [address], [place], [place] [address] [phone] Mon-Fri 8:00-5:00 Babies seen by providers at [facility] Accepting Medicaid [facility] ([facility]) Eutaw, MD; Kristi Fleenor, NP; West, MD [address], [place], [place] [address] [phone] Mon-Fri 8:00-5:30, Sat&Sun by appointment (phones open at  8:30) Babies seen by [facility] providers Accepting Medicaid Must be a first-time baby or sibling of current patient [facility] [place]  [address], [place], [place]  [address] [phone]   Fax - [phone]  [place] ([address]) [facility] Brown, PA; Cowen, PA; Rice, MD; Helton, PA; Spry, MD [address] [place], [place] [address] [phone] Mon-Thur 8:00-7:00, Fri 8:00-5:00, Sat 8:00-12:00, Sun 9:00-12:00 Babies seen by [facility] providers Accepting Medicaid [facility] [place] Coe-Goins, MD; Marshall, MD; Pierre-Louis, MD [address] [place] [address], [place], [place] [address] [phone] Mon-Thur 8:00-5:00 Babies seen by providers at [facility] Accepting Medicaid [facility] Bratton, MD; Coe-Goins, MD; Hayes, MD; Lewis, MD; List, MD; Lott, MD; Marshall, MD; Moran, MD; O'Neal, MD; Pierre-Louis, MD; Pitonzo, MD; Scholer, MD; Spangle, MD [address] [place], [place] [address] [phone] Mon-Fri 8:00-5:30, Sat (Oct.-Mar.) 9:00-1:00 Babies seen by providers at [facility] Accepting Medicaid Must fill out new patient packet, available online at [email or web address] [facility] ([facility]) Friddle, NP; Harris, NP; Kelly, NP; Logan, MD; Melvin, PA; Poth, MD; Ramadoss, MD; Stanton, NP [address], [place], [place] [address] [phone] Mon-Thur 8:00-5:30, Fri 8:00-5:00 Babies seen by providers at [facility] Accepting Medicaid  [place] ([address]) [facility] Dixon, PA; Mallory, MD; Pickard, MD; Tapia, PA [address], [place], [place] [address] [phone] Mon-Fri 8:00-5:00 Babies seen by providers at [facility] Accepting Medicaid   [place] ([address]) [facility] [place] Masneri, DO; Meyers, MD; Nelson, PA [address], [place], [place] [address] [phone] Mon-Fri 8:00-5:00 Babies seen by providers at [facility] Does NOT accept Medicaid Limited appointment availability, please call early in hospitalization  [facility] [place] Kunedd, DO; McGowen, MD [address], [place], [place] [address] [phone] Mon-Fri 8:00-5:00 Babies seen by [facility] providers Does NOT accept Medicaid [facility] [place] Cameron, MD; MacDonald, MD; Michaels, PA; Nayak, MD [address] [place] [address], [place],  [address] [phone] Mon-Fri 8:00-5:00 After hours clinic ([address] [place],  [address]) [phone] Mon-Fri 5:00-8:00, Sat 12:00-6:00, Sun 10:00-4:00 Babies seen by [facility] providers Accepting Medicaid [facility] [place] [address], [place], [place]  [address] [phone]   Fax - [phone]  [place] ([address]) [facility] [place] [facility] Andy, MD [address], [place], [place] [address] [phone] Mon-Fri 8:00-5:00 Babies seen by [facility] providers Does NOT accept Medicaid [facility] [place] ([facility] [place]) Eksir, MD [address], [place], [place] [address] [phone] Mon-Thur 8:00-7:00, Fri 8:00-5:00, Sat 8:00-12:00 Babies seen by providers at [facility] Accepting Medicaid - but does not have vaccinations in office (must be received elsewhere) Limited availability, please call early in hospitalization  [place] ([address]) [facility]  Charlene Flemming, MD [address], [place] [address] [phone]  Fax [phone]  [facility]  Kimberly Newton, MD, Annamarie Streilein, PA, Carla Hampton, PA [address] [place], [place]  [address] [phone] [facility]  [address], [place], [place] [address] [phone] [address], [place], [place] [address] [phone] (West Office)  [facility] [address], [place], [place] [address] [phone] [facility] [address], [place], [place] [address] [phone] [facility] [address], [place], [place] [address] [phone] [facility] [address], [place], [place] [address] [phone] [facility] [address], [place], [place] [address] [phone] [facility] [address], [place], [place] [address] [phone] [facility]  [address], [place], [place] [address] [phone] Dr[facility] [address], [place], [place] [address] [phone] [facility] [address], [place],  [address] [phone] [facility] [address], ,  [address] [phone]  

## 2021-04-07 NOTE — Progress Notes (Signed)
   PRENATAL VISIT NOTE  Subjective:  Emily Barajas is a 25 y.o. G1P0000 at [redacted]w[redacted]d being seen today for ongoing prenatal care.  She is currently monitored for the following issues for this high-risk pregnancy and has Substance-induced psychotic disorder (HCC); Hepatitis C; History of substance abuse (HCC); Supervision of normal first pregnancy; Rh negative state in antepartum period; History of ELISA positive for HSV; Anxiety; and Genital herpes simplex on their problem list.  Patient reports no complaints.  Contractions: Not present.  .  Movement: Present. Denies leaking of fluid.   The following portions of the patient's history were reviewed and updated as appropriate: allergies, current medications, past family history, past medical history, past social history, past surgical history and problem list.   Objective:   Vitals:   04/07/21 1027  BP: 136/86  Pulse: 85  Weight: 144 lb 6.4 oz (65.5 kg)    Fetal Status: Fetal Heart Rate (bpm): 143 Fundal Height: 22 cm Movement: Present     General:  Alert, oriented and cooperative. Patient is in no acute distress.  Skin: Skin is warm and dry. No rash noted.   Cardiovascular: Normal heart rate noted  Respiratory: Normal respiratory effort, no problems with respiration noted  Abdomen: Soft, gravid, appropriate for gestational age.  Pain/Pressure: Absent     Pelvic: Cervical exam deferred        Extremities: Normal range of motion.  Edema: None  Mental Status: Normal mood and affect. Normal behavior. Normal judgment and thought content.   Assessment and Plan:  Pregnancy: G1P0000 at [redacted]w[redacted]d 1. Encounter for supervision of normal first pregnancy in second trimester - Unsure of MOC, info given  - Planning to breastfeed - Peds list given  - Reviewed normal anatomy from last visit, EFW 49%  2. Rh negative state in antepartum period - Rhogam at 28 weeks   3. History of substance abuse (HCC)  4. Chronic hepatitis C without hepatic coma  (HCC)  5. History of ELISA positive for HSV  6. Herpes simplex vulvovaginitis - Ppx at 35 weeks   7. [redacted] weeks gestation of pregnancy  8. Genetic screening - INTEGRATED 2  9. Slow transit constipation - polyethylene glycol powder (GLYCOLAX/MIRALAX) 17 GM/SCOOP powder; Take 255 g by mouth once for 1 dose.  Dispense: 255 g; Refill: 0 - docusate sodium (COLACE) 100 MG capsule; Take 1 capsule (100 mg total) by mouth 2 (two) times daily.  Dispense: 10 capsule; Refill: 0  10. Nausea and vomiting in pregnancy prior to [redacted] weeks gestation - promethazine (PHENERGAN) 25 MG tablet; Take 1 tablet (25 mg total) by mouth every 6 (six) hours as needed for nausea or vomiting.  Dispense: 30 tablet; Refill: 1 - Continue Zofran as well PRN  - Improvement in symptoms  - 9# weight gain noted   Preterm labor symptoms and general obstetric precautions including but not limited to vaginal bleeding, contractions, leaking of fluid and fetal movement were reviewed in detail with the patient. Please refer to After Visit Summary for other counseling recommendations.   Return in about 4 weeks (around 05/05/2021) for LOB, In-Person.  No future appointments.  Vonzella Nipple, PA-C

## 2021-04-10 LAB — INTEGRATED 2
AFP MoM: 1
Alpha-Fetoprotein: 72.9 ng/mL
Crown Rump Length: 80.9 mm
DIA MoM: 1.44
DIA Value: 357.4 pg/mL
Estriol, Unconjugated: 2.78 ng/mL
Gest. Age on Collection Date: 13.7 weeks
Gestational Age: 21.7 weeks
Maternal Age at EDD: 24.7 yr
Nuchal Translucency (NT): 1.8 mm
Nuchal Translucency MoM: 0.94
Number of Fetuses: 1
PAPP-A MoM: 0.86
PAPP-A Value: 1414.2 ng/mL
Test Results:: NEGATIVE
Weight: 142 [lb_av]
Weight: 142 [lb_av]
hCG MoM: 0.99
hCG Value: 23.3 IU/mL
uE3 MoM: 0.84

## 2021-04-23 ENCOUNTER — Other Ambulatory Visit: Payer: Self-pay | Admitting: Medical

## 2021-04-23 ENCOUNTER — Other Ambulatory Visit: Payer: Self-pay | Admitting: Obstetrics

## 2021-04-23 DIAGNOSIS — O219 Vomiting of pregnancy, unspecified: Secondary | ICD-10-CM

## 2021-05-05 ENCOUNTER — Other Ambulatory Visit: Payer: Self-pay

## 2021-05-05 ENCOUNTER — Encounter: Payer: Self-pay | Admitting: Family Medicine

## 2021-05-05 ENCOUNTER — Ambulatory Visit (INDEPENDENT_AMBULATORY_CARE_PROVIDER_SITE_OTHER): Payer: 59 | Admitting: Family Medicine

## 2021-05-05 VITALS — BP 133/75 | HR 86 | Wt 153.4 lb

## 2021-05-05 DIAGNOSIS — M5442 Lumbago with sciatica, left side: Secondary | ICD-10-CM

## 2021-05-05 DIAGNOSIS — Z8619 Personal history of other infectious and parasitic diseases: Secondary | ICD-10-CM

## 2021-05-05 DIAGNOSIS — O26899 Other specified pregnancy related conditions, unspecified trimester: Secondary | ICD-10-CM

## 2021-05-05 DIAGNOSIS — F1911 Other psychoactive substance abuse, in remission: Secondary | ICD-10-CM

## 2021-05-05 DIAGNOSIS — Z3402 Encounter for supervision of normal first pregnancy, second trimester: Secondary | ICD-10-CM

## 2021-05-05 DIAGNOSIS — Z6791 Unspecified blood type, Rh negative: Secondary | ICD-10-CM

## 2021-05-05 DIAGNOSIS — Z880 Allergy status to penicillin: Secondary | ICD-10-CM

## 2021-05-05 NOTE — Progress Notes (Signed)
    Subjective:  Emily Barajas is a 25 y.o. G1P0000 at [redacted]w[redacted]d being seen today for ongoing prenatal care.  She is currently monitored for the following issues for this low-risk pregnancy and has Substance-induced psychotic disorder (HCC); Hepatitis C; History of substance abuse (HCC); Supervision of normal first pregnancy; Rh negative state in antepartum period; History of ELISA positive for HSV; Anxiety; and Genital herpes simplex on their problem list.  Patient reports backache with occasional irritation down her left leg. No weakness, numbness, or saddle anesthesia. Otherwise states she is doing well.   Contractions: Not present. Vag. Bleeding: None.  Movement: Present. Denies leaking of fluid.   The following portions of the patient's history were reviewed and updated as appropriate: allergies, current medications, past family history, past medical history, past social history, past surgical history and problem list.   Objective:   Vitals:   05/05/21 1016  BP: 133/75  Pulse: 86  Weight: 153 lb 6.4 oz (69.6 kg)    Fetal Status: Fetal Heart Rate (bpm): 140 Fundal Height: 25 cm Movement: Present     General:  Alert, oriented and cooperative. Patient is in no acute distress.  Skin: Skin is warm and dry. No rash noted.   Cardiovascular: Normal heart rate noted  Respiratory: Normal respiratory effort, no problems with respiration noted  Abdomen: Soft, gravid, appropriate for gestational age. Pain/Pressure: Absent     Pelvic:  Cervical exam deferred        Extremities: Normal range of motion. Non-specifically sore around lumbar paraspinal musculature around L5, non-tender to palpation of spinous process. 5/5 strength with normal gait. After verbal consent, performed lumbar roll and soft tissue massage to lumbar paraspinal muscles. Patient tolerated well.   Edema: None  Mental Status: Normal mood and affect. Normal behavior. Normal judgment and thought content.   Urinalysis:       Assessment and Plan:  Pregnancy: G1P0000 at [redacted]w[redacted]d  1. Encounter for supervision of normal first pregnancy in second trimester Doing well with normal fetal movement.   2. Rh negative state in antepartum period RhoGAM at next appt/28 weeks.   3. History of ELISA positive for HSV On suppression therapy.   4. Penicillin allergy Anaphylaxis reported in epic. Should have sensitivities order on GBS in third trimester.   5. History of substance abuse (HCC) Doing well currently.   6. History of hepatitis C virus infection Antibody positive, RNA quant not detected> resolved infection. Could additionally check with 3rd trimester labs to ensure stability.   7. Bilateral low back pain with left-sided sciatica, unspecified chronicity Performed indirect OMM (lumbar roll/soft tissue) with relief during visit. Showed partner how to continue this for her at home. Can also use tylenol/belly band/tape. Return precautions discussed.   Preterm labor symptoms and general obstetric precautions including but not limited to vaginal bleeding, contractions, leaking of fluid and fetal movement were reviewed in detail with the patient. Please refer to After Visit Summary for other counseling recommendations.  Return in about 2 weeks (around 05/19/2021) for LROB with GTT.   Allayne Stack, DO

## 2021-05-23 ENCOUNTER — Other Ambulatory Visit: Payer: 59

## 2021-05-23 ENCOUNTER — Ambulatory Visit (INDEPENDENT_AMBULATORY_CARE_PROVIDER_SITE_OTHER): Payer: 59 | Admitting: Obstetrics & Gynecology

## 2021-05-23 ENCOUNTER — Encounter: Payer: Self-pay | Admitting: Obstetrics & Gynecology

## 2021-05-23 ENCOUNTER — Other Ambulatory Visit: Payer: Self-pay

## 2021-05-23 VITALS — BP 130/73 | HR 97 | Wt 157.0 lb

## 2021-05-23 DIAGNOSIS — Z3A28 28 weeks gestation of pregnancy: Secondary | ICD-10-CM

## 2021-05-23 DIAGNOSIS — Z3402 Encounter for supervision of normal first pregnancy, second trimester: Secondary | ICD-10-CM

## 2021-05-23 NOTE — Progress Notes (Signed)
   LOW-RISK PREGNANCY VISIT Patient name: Emily Barajas MRN [id number]  Date of birth: 1996-03-26 Chief Complaint:   Routine Prenatal Visit (PN2)  History of Present Illness:   Emily Barajas is a 25 y.o. G36P0000 female at [redacted]w[redacted]d with an Estimated Date of Delivery: 08/12/21 being seen today for ongoing management of a low-risk pregnancy.  Depression screen [facility] 2/9 05/23/2021 06/13/2020  Decreased Interest 0 2  Down, Depressed, Hopeless 0 2  PHQ - 2 Score 0 4  Altered sleeping 3 2  Tired, decreased energy 2 2  Change in appetite 0 3  Feeling bad or failure about yourself  0 2  Trouble concentrating 0 3  Moving slowly or fidgety/restless 0 2  Suicidal thoughts 0 0  PHQ-9 Score 5 18    Today she reports no complaints. Contractions: Not present. Vag. Bleeding: None.  Movement: Present. denies leaking of fluid. Review of Systems:   Pertinent items are noted in HPI Denies abnormal vaginal discharge w/ itching/odor/irritation, headaches, visual changes, shortness of breath, chest pain, abdominal pain, severe nausea/vomiting, or problems with urination or bowel movements unless otherwise stated above. Pertinent History Reviewed:  Reviewed past medical,surgical, social, obstetrical and family history.  Reviewed problem list, medications and allergies. Physical Assessment:   Vitals:   05/23/21 0837  BP: 130/73  Pulse: 97  Weight: 157 lb (71.2 kg)  Body mass index is 28.95 kg/m.        Physical Examination:   General appearance: Well appearing, and in no distress  Mental status: Alert, oriented to person, place, and time  Skin: Warm & dry  Cardiovascular: Normal heart rate noted  Respiratory: Normal respiratory effort, no distress  Abdomen: Soft, gravid, nontender  Pelvic: Cervical exam deferred         Extremities: Edema: None  Fetal Status: Fetal Heart Rate (bpm): 138 Fundal Height: 28 cm Movement: Present    Chaperone: n/a    No results found for this or any previous  visit (from the past 24 hour(s)).  Assessment & Plan:  1) Low-risk pregnancy G1P0000 at [redacted]w[redacted]d with an Estimated Date of Delivery: 08/12/21   2) Hx of substance abuse,    Meds: No orders of the defined types were placed in this encounter.  Labs/procedures today: PN2  Plan:  Continue routine obstetrical care  Next visit: prefers in person    Reviewed: Preterm labor symptoms and general obstetric precautions including but not limited to vaginal bleeding, contractions, leaking of fluid and fetal movement were reviewed in detail with the patient.  All questions were answered. Has home bp cuff. Rx faxed to . Check bp weekly, let us know if >140/90.   Follow-up: Return in about 3 weeks (around 06/13/2021) for LROB, 1 week RhoGam.  No orders of the defined types were placed in this encounter.   Lazaro Arms, MD 05/23/2021 9:10 AM

## 2021-05-24 LAB — CBC
Hematocrit: 32.5 % — ABNORMAL LOW (ref 34.0–46.6)
Hemoglobin: 11.1 g/dL (ref 11.1–15.9)
MCH: 30.9 pg (ref 26.6–33.0)
MCHC: 34.2 g/dL (ref 31.5–35.7)
MCV: 91 fL (ref 79–97)
Platelets: 256 10*3/uL (ref 150–450)
RBC: 3.59 x10E6/uL — ABNORMAL LOW (ref 3.77–5.28)
RDW: 11.9 % (ref 11.7–15.4)
WBC: 10 10*3/uL (ref 3.4–10.8)

## 2021-05-24 LAB — GLUCOSE TOLERANCE, 2 HOURS W/ 1HR
Glucose, 1 hour: 169 mg/dL (ref 70–179)
Glucose, 2 hour: 118 mg/dL (ref 70–152)
Glucose, Fasting: 77 mg/dL (ref 70–91)

## 2021-05-24 LAB — RPR: RPR Ser Ql: NONREACTIVE

## 2021-05-24 LAB — HIV ANTIBODY (ROUTINE TESTING W REFLEX): HIV Screen 4th Generation wRfx: NONREACTIVE

## 2021-05-24 LAB — ANTIBODY SCREEN: Antibody Screen: NEGATIVE

## 2021-05-30 ENCOUNTER — Other Ambulatory Visit: Payer: Self-pay

## 2021-05-30 ENCOUNTER — Ambulatory Visit (INDEPENDENT_AMBULATORY_CARE_PROVIDER_SITE_OTHER): Payer: Medicaid Other | Admitting: Obstetrics & Gynecology

## 2021-05-30 DIAGNOSIS — Z6791 Unspecified blood type, Rh negative: Secondary | ICD-10-CM

## 2021-05-30 DIAGNOSIS — O26899 Other specified pregnancy related conditions, unspecified trimester: Secondary | ICD-10-CM | POA: Diagnosis not present

## 2021-05-30 DIAGNOSIS — Z3A29 29 weeks gestation of pregnancy: Secondary | ICD-10-CM | POA: Diagnosis not present

## 2021-05-30 DIAGNOSIS — Z3403 Encounter for supervision of normal first pregnancy, third trimester: Secondary | ICD-10-CM | POA: Diagnosis not present

## 2021-05-30 NOTE — Progress Notes (Signed)
   LOW-RISK PREGNANCY VISIT Patient name: Emily Barajas MRN [id number]  Date of birth: 1995/11/25 Chief Complaint:   leaking fluid  History of Present Illness:   Emily Barajas is a 25 y.o. G72P0000 female at [redacted]w[redacted]d with an Estimated Date of Delivery: 08/12/21 being seen today for ongoing management of a low-risk pregnancy.   Pt added to the schedule today as she reported leaking of discharge.  Wears a pad- not sure if the discharge is clear or white, just notes that her underwear are wet.  Sometimes it will seem to be happen frequently for a few days and then nothing.    Denies vaginal bleeding, denies contractions.  + Fetal movement  Depression screen [facility] 2/9 05/23/2021 06/13/2020  Decreased Interest 0 2  Down, Depressed, Hopeless 0 2  PHQ - 2 Score 0 4  Altered sleeping 3 2  Tired, decreased energy 2 2  Change in appetite 0 3  Feeling bad or failure about yourself  0 2  Trouble concentrating 0 3  Moving slowly or fidgety/restless 0 2  Suicidal thoughts 0 0  PHQ-9 Score 5 18    Review of Systems:   Pertinent items are noted in HPI Denies headaches, visual changes, shortness of breath, chest pain, abdominal pain, severe nausea/vomiting, or problems with urination or bowel movements unless otherwise stated above. Pertinent History Reviewed:  Reviewed past medical,surgical, social, obstetrical and family history.  Reviewed problem list, medications and allergies.  Physical Assessment:   Vitals:   05/30/21 1637  BP: 130/86  Pulse: 93  Weight: 158 lb (71.7 kg)  Body mass index is 29.13 kg/m.        Physical Examination:   General appearance: Well appearing, and in no distress  Mental status: Alert, oriented to person, place, and time  Skin: Warm & dry  Respiratory: Normal respiratory effort, no distress  Abdomen: Soft, gravid, nontender  Pelvic:  normal external genitalia, sterile speculum placed- negative pooling, physiologic white discharge noted, cervix does not  appear dilated        Extremities: Edema: None  Psych:  mood and affect appropriate  Fetal Status:     Movement: Present    Chaperone: Latisha Cresenzo    No results found for this or any previous visit (from the past 24 hour(s)).   Assessment & Plan:  1) Low-risk pregnancy G1P0000 at [redacted]w[redacted]d with an Estimated Date of Delivery: 08/12/21   2) Rule out rupture -negative pooling, negative ferning -AFI appears normal by bedside ultrasound   Meds: No orders of the defined types were placed in this encounter.  Plan:  Continue routine obstetrical care  Next visit: prefers in person    Reviewed: Preterm labor symptoms and general obstetric precautions including but not limited to vaginal bleeding, contractions, leaking of fluid and fetal movement were reviewed in detail with the patient.  All questions were answered.   Follow-up: as previously scheduled  No orders of the defined types were placed in this encounter.   Myna Hidalgo, DO Attending Obstetrician & Gynecologist, [facility] for [employer], [facility]

## 2021-06-16 ENCOUNTER — Ambulatory Visit (INDEPENDENT_AMBULATORY_CARE_PROVIDER_SITE_OTHER): Payer: 59 | Admitting: Advanced Practice Midwife

## 2021-06-16 ENCOUNTER — Other Ambulatory Visit: Payer: Self-pay

## 2021-06-16 VITALS — BP 134/86 | HR 106 | Wt 166.0 lb

## 2021-06-16 DIAGNOSIS — Z3403 Encounter for supervision of normal first pregnancy, third trimester: Secondary | ICD-10-CM

## 2021-06-16 DIAGNOSIS — Z8619 Personal history of other infectious and parasitic diseases: Secondary | ICD-10-CM

## 2021-06-16 DIAGNOSIS — F1911 Other psychoactive substance abuse, in remission: Secondary | ICD-10-CM

## 2021-06-16 DIAGNOSIS — Z3A31 31 weeks gestation of pregnancy: Secondary | ICD-10-CM

## 2021-06-16 DIAGNOSIS — Z6791 Unspecified blood type, Rh negative: Secondary | ICD-10-CM

## 2021-06-16 DIAGNOSIS — O26899 Other specified pregnancy related conditions, unspecified trimester: Secondary | ICD-10-CM

## 2021-06-16 DIAGNOSIS — B182 Chronic viral hepatitis C: Secondary | ICD-10-CM

## 2021-06-16 MED ORDER — ACYCLOVIR 400 MG PO TABS: 400.0000 mg | ORAL_TABLET | Freq: Three times a day (TID) | ORAL | 3 refills | Status: AC

## 2021-06-16 NOTE — Patient Instructions (Signed)
[place], thank you for choosing our office today! We appreciate the opportunity to meet your healthcare needs. You may receive a short survey by mail, e-mail, or through [employer]. If you are happy with your care we would appreciate if you could take just a few minutes to complete the survey questions. We read all of your comments and take your feedback very seriously. Thank you again for choosing our office.  Center for [employer] Team at [facility]  [facility] at [facility] ([address] [place], [place] [address]) Entrance C, located off of [address] Free 24/7 valet parking   CLASSES: Go to [email or web address] to register for classes (childbirth, breastfeeding, waterbirth, infant CPR, daddy bootcamp, etc.)  Call the office [phone]) or go to [facility] if: You begin to have strong, frequent contractions Your water breaks.  Sometimes it is a big gush of fluid, sometimes it is just a trickle that keeps getting your panties wet or running down your legs You have vaginal bleeding.  It is normal to have a small amount of spotting if your cervix was checked.  You don't feel your baby moving like normal.  If you don't, get you something to eat and drink and lay down and focus on feeling your baby move.   If your baby is still not moving like normal, you should call the office or go to [facility].  Call the office [phone]) or go to [facility] hospital for these signs of pre-eclampsia: Severe headache that does not go away with Tylenol Visual changes- seeing spots, double, blurred vision Pain under your right breast or upper abdomen that does not go away with Tums or heartburn medicine Nausea and/or vomiting Severe swelling in your hands, feet, and face   Tdap Vaccine It is recommended that you get the Tdap vaccine during the third trimester of EACH pregnancy to help protect your baby from getting pertussis (whooping cough) 27-36 weeks is the BEST time to do  this so that you can pass the protection on to your baby. During pregnancy is better than after pregnancy, but if you are unable to get it during pregnancy it will be offered at the hospital.  You can get this vaccine with [place], at the health department, your family doctor, or some local pharmacies Everyone who will be around your baby should also be up-to-date on their vaccines before the baby comes. Adults (who are not pregnant) only need 1 dose of Tdap during adulthood.   [facility] Pediatricians/Family Doctors [facility]): [address] Colette Ribas, [phone]           [facility]: [address], [phone]                [facility]): [address], [phone] (call to ask if accepting patients) [facility] Department: [address], [place], [phone]    [facility] Pediatricians/Family Doctors [facility]): [phone], [phone] [facility]: [address] [place], [phone] [facility]: [address], [phone]  [facility] Doctors  [facility]): [phone] [facility]: [address], [phone]   [facility] Doctors [facility]: [address] [phone]  [facility] Family Doctors  [facility]: [phone], [phone]  Home Blood Pressure Monitoring for Patients   Your provider has recommended that you check your  blood pressure (BP) at least once a week at home. If you do not have a blood pressure cuff at home, one will be provided for you. Contact your provider if you have not received your monitor within 1 week.   Helpful Tips for Accurate Home Blood Pressure Checks  Don't smoke, exercise, or drink caffeine 30 minutes before checking your BP Use the restroom before checking your BP (a full bladder can raise your  pressure) Relax in a comfortable upright chair Feet on the ground Left arm resting comfortably on a flat surface at the level of your heart Legs uncrossed Back supported Sit quietly and don't talk Place the cuff on your bare arm Adjust snuggly, so that only two fingertips can fit between your skin and the top of the cuff Check 2 readings separated by at least one minute Keep a log of your BP readings For a visual, please reference this diagram: http://ccnc.care/bpdiagram  Provider Name: [facility]     Phone: [phone]  Zone 1: ALL CLEAR  Continue to monitor your symptoms:  BP reading is less than 140 (top number) or less than 90 (bottom number)  No right upper stomach pain No headaches or seeing spots No feeling nauseated or throwing up No swelling in face and hands  Zone 2: CAUTION Call your doctor's office for any of the following:  BP reading is greater than 140 (top number) or greater than 90 (bottom number)  Stomach pain under your ribs in the middle or right side Headaches or seeing spots Feeling nauseated or throwing up Swelling in face and hands  Zone 3: EMERGENCY  Seek immediate medical care if you have any of the following:  BP reading is greater than160 (top number) or greater than 110 (bottom number) Severe headaches not improving with Tylenol Serious difficulty catching your breath Any worsening symptoms from Zone 2   Third Trimester of Pregnancy The third trimester is from week 29 through week 42, months 7 through 9. The third trimester is a time when the fetus is growing rapidly. At the end of the ninth month, the fetus is about 20 inches in length and weighs 6-10 pounds.  BODY CHANGES Your body goes through many changes during pregnancy. The changes vary from woman to woman.  Your weight will continue to increase. You can expect to gain 25-35 pounds (11-16 kg) by the end of the pregnancy. You may begin to get stretch marks on your hips, abdomen,  and breasts. You may urinate more often because the fetus is moving lower into your pelvis and pressing on your bladder. You may develop or continue to have heartburn as a result of your pregnancy. You may develop constipation because certain hormones are causing the muscles that push waste through your intestines to slow down. You may develop hemorrhoids or swollen, bulging veins (varicose veins). You may have pelvic pain because of the weight gain and pregnancy hormones relaxing your joints between the bones in your pelvis. Backaches may result from overexertion of the muscles supporting your posture. You may have changes in your hair. These can include thickening of your hair, rapid growth, and changes in texture. Some women also have hair loss during or after pregnancy, or hair that feels dry or thin. Your hair will most likely return to normal after your baby is born. Your breasts will continue to grow and be tender. A yellow discharge may leak from your breasts called colostrum. Your belly button may stick out. You may   feel short of breath because of your expanding uterus. You may notice the fetus "dropping," or moving lower in your abdomen. You may have a bloody mucus discharge. This usually occurs a few days to a week before labor begins. Your cervix becomes thin and soft (effaced) near your due date. WHAT TO EXPECT AT YOUR PRENATAL EXAMS  You will have prenatal exams every 2 weeks until week 36. Then, you will have weekly prenatal exams. During a routine prenatal visit: You will be weighed to make sure you and the fetus are growing normally. Your blood pressure is taken. Your abdomen will be measured to track your baby's growth. The fetal heartbeat will be listened to. Any test results from the previous visit will be discussed. You may have a cervical check near your due date to see if you have effaced. At around 36 weeks, your caregiver will check your cervix. At the same time, your  caregiver will also perform a test on the secretions of the vaginal tissue. This test is to determine if a type of bacteria, Group B streptococcus, is present. Your caregiver will explain this further. Your caregiver may ask you: What your birth plan is. How you are feeling. If you are feeling the baby move. If you have had any abnormal symptoms, such as leaking fluid, bleeding, severe headaches, or abdominal cramping. If you have any questions. Other tests or screenings that may be performed during your third trimester include: Blood tests that check for low iron levels (anemia). Fetal testing to check the health, activity level, and growth of the fetus. Testing is done if you have certain medical conditions or if there are problems during the pregnancy. FALSE LABOR You may feel small, irregular contractions that eventually go away. These are called Braxton Hicks contractions, or false labor. Contractions may last for hours, days, or even weeks before true labor sets in. If contractions come at regular intervals, intensify, or become painful, it is best to be seen by your caregiver.  SIGNS OF LABOR  Menstrual-like cramps. Contractions that are 5 minutes apart or less. Contractions that start on the top of the uterus and spread down to the lower abdomen and back. A sense of increased pelvic pressure or back pain. A watery or bloody mucus discharge that comes from the vagina. If you have any of these signs before the 37th week of pregnancy, call your caregiver right away. You need to go to the hospital to get checked immediately. HOME CARE INSTRUCTIONS  Avoid all smoking, herbs, alcohol, and unprescribed drugs. These chemicals affect the formation and growth of the baby. Follow your caregiver's instructions regarding medicine use. There are medicines that are either safe or unsafe to take during pregnancy. Exercise only as directed by your caregiver. Experiencing uterine cramps is a good sign to  stop exercising. Continue to eat regular, healthy meals. Wear a good support bra for breast tenderness. Do not use hot tubs, steam rooms, or saunas. Wear your seat belt at all times when driving. Avoid raw meat, uncooked cheese, cat litter boxes, and soil used by cats. These carry germs that can cause birth defects in the baby. Take your prenatal vitamins. Try taking a stool softener (if your caregiver approves) if you develop constipation. Eat more high-fiber foods, such as fresh vegetables or fruit and whole grains. Drink plenty of fluids to keep your urine clear or pale yellow. Take warm sitz baths to soothe any pain or discomfort caused by hemorrhoids. Use hemorrhoid cream if   your caregiver approves. If you develop varicose veins, wear support hose. Elevate your feet for 15 minutes, 3-4 times a day. Limit salt in your diet. Avoid heavy lifting, wear low heal shoes, and practice good posture. Rest a lot with your legs elevated if you have leg cramps or low back pain. Visit your dentist if you have not gone during your pregnancy. Use a soft toothbrush to brush your teeth and be gentle when you floss. A sexual relationship may be continued unless your caregiver directs you otherwise. Do not travel far distances unless it is absolutely necessary and only with the approval of your caregiver. Take prenatal classes to understand, practice, and ask questions about the labor and delivery. Make a trial run to the hospital. Pack your hospital bag. Prepare the baby's nursery. Continue to go to all your prenatal visits as directed by your caregiver. SEEK MEDICAL CARE IF: You are unsure if you are in labor or if your water has broken. You have dizziness. You have mild pelvic cramps, pelvic pressure, or nagging pain in your abdominal area. You have persistent nausea, vomiting, or diarrhea. You have a bad smelling vaginal discharge. You have pain with urination. SEEK IMMEDIATE MEDICAL CARE IF:  You  have a fever. You are leaking fluid from your vagina. You have spotting or bleeding from your vagina. You have severe abdominal cramping or pain. You have rapid weight loss or gain. You have shortness of breath with chest pain. You notice sudden or extreme swelling of your face, hands, ankles, feet, or legs. You have not felt your baby move in over an hour. You have severe headaches that do not go away with medicine. You have vision changes. Document Released: 07/24/2001 Document Revised: 08/04/2013 Document Reviewed: 09/30/2012 [facility] Patient Information 2015 [place], [place]. This information is not intended to replace advice given to you by your health care provider. Make sure you discuss any questions you have with your health care provider.

## 2021-06-16 NOTE — Progress Notes (Addendum)
   LOW-RISK PREGNANCY VISIT Patient name: Emily Barajas MRN [id number]  Date of birth: 30-Apr-1996 Chief Complaint:   Routine Prenatal Visit  History of Present Illness:   Emily Barajas is a 25 y.o. G54P0000 female at [redacted]w[redacted]d with an Estimated Date of Delivery: 08/12/21 being seen today for ongoing management of a low-risk pregnancy.  Today she reports  that she is doing well; may want to tx to OB in [place] due to [facility] being closer- will sign ROI today for records . Contractions: Not present. Vag. Bleeding: None.  Movement: Present. denies leaking of fluid. Review of Systems:   Pertinent items are noted in HPI Denies abnormal vaginal discharge w/ itching/odor/irritation, headaches, visual changes, shortness of breath, chest pain, abdominal pain, severe nausea/vomiting, or problems with urination or bowel movements unless otherwise stated above. Pertinent History Reviewed:  Reviewed past medical,surgical, social, obstetrical and family history.  Reviewed problem list, medications and allergies. Physical Assessment:   Vitals:   06/16/21 0946  BP: 134/86  Pulse: (!) 106  Weight: 166 lb (75.3 kg)  Body mass index is 30.61 kg/m.        Physical Examination:   General appearance: Well appearing, and in no distress  Mental status: Alert, oriented to person, place, and time  Skin: Warm & dry  Cardiovascular: Normal heart rate noted  Respiratory: Normal respiratory effort, no distress  Abdomen: Soft, gravid, nontender  Pelvic: Cervical exam deferred         Extremities: Edema: None  Fetal Status: Fetal Heart Rate (bpm): 150 Fundal Height: 32 cm Movement: Present    No results found for this or any previous visit (from the past 24 hour(s)).  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    Dispense:  90 tablet    Refill:  3    Order Specific Question:   Supervising Provider    Answer:   Grove [[id number]]   Labs/procedures today: none  Plan:  Continue routine obstetrical care   Reviewed: Preterm labor symptoms and general obstetric precautions including but not limited to vaginal bleeding, contractions, leaking of fluid and fetal movement were reviewed in detail with the patient.  All questions were answered. Has home bp cuff. Check bp weekly, let Lazaro Arms know if >140/90.   Follow-up: Return in about 2 weeks (around 06/30/2021) for LROB, in person; sign ROI to tx to OB in [place].  No orders of the defined types were placed in this encounter.  Grove CNM 06/16/2021 10:25 AM

## 2021-06-30 ENCOUNTER — Encounter: Payer: 59 | Admitting: Obstetrics & Gynecology

## 2021-08-12 ENCOUNTER — Inpatient Hospital Stay (HOSPITAL_COMMUNITY): Admit: 2021-08-12 | Payer: Medicaid Other | Admitting: Obstetrics and Gynecology

## 2021-09-22 IMAGING — US US ABDOMEN LIMITED W/ ELASTOGRAPHY
1 series · 12 of 25 positions shown · non-contrast
Comparison: CT AP 01/04/2020

CLINICAL DATA: Hepatitis C.

EXAM:
US ABDOMEN LIMITED - RIGHT UPPER QUADRANT
ULTRASOUND HEPATIC ELASTOGRAPHY
TECHNIQUE: Sonography of the right upper quadrant was performed. In addition,
ultrasound elastography evaluation of the liver was performed. A
region of interest was placed within the right lobe of the liver.
Following application of a compressive sonographic pulse, tissue
compressibility was assessed. Multiple assessments were performed at
the selected site. Median tissue compressibility was determined.
Previously, hepatic stiffness was assessed by shear wave velocity.
Based on recently published Society of Radiologists in Ultrasound
consensus article, reporting is now recommended to be performed in
the SI units of pressure (kiloPascals) representing hepatic
stiffness/elasticity. The obtained result is compared to the
published reference standards. (cACLD = compensated Advanced Chronic
Liver Disease)

[Series 1: us abdomen ruq w/elastography · 12 of 50 slices shown]
[im 3/50]
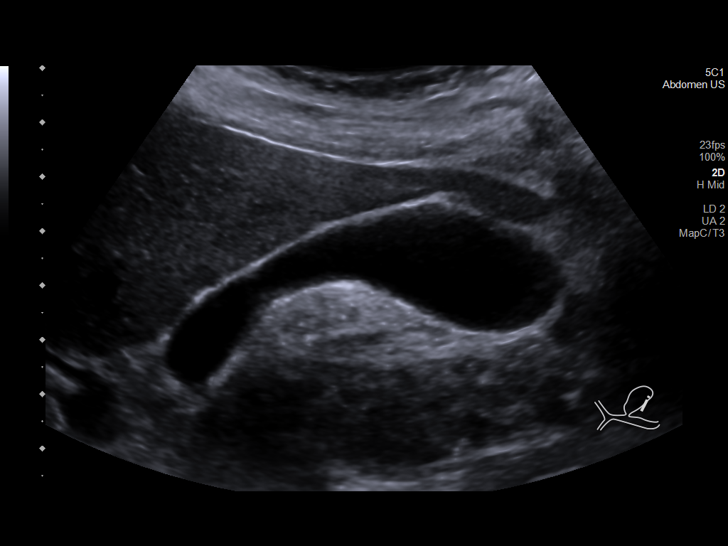
[im 7/50]
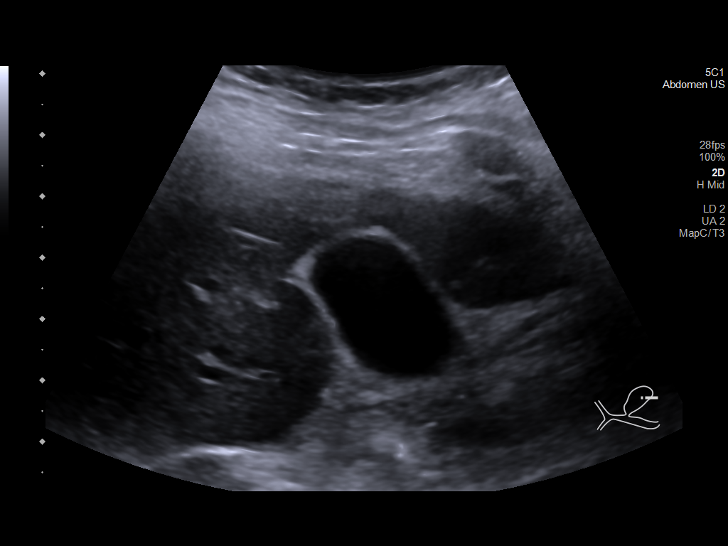
[im 11/50]
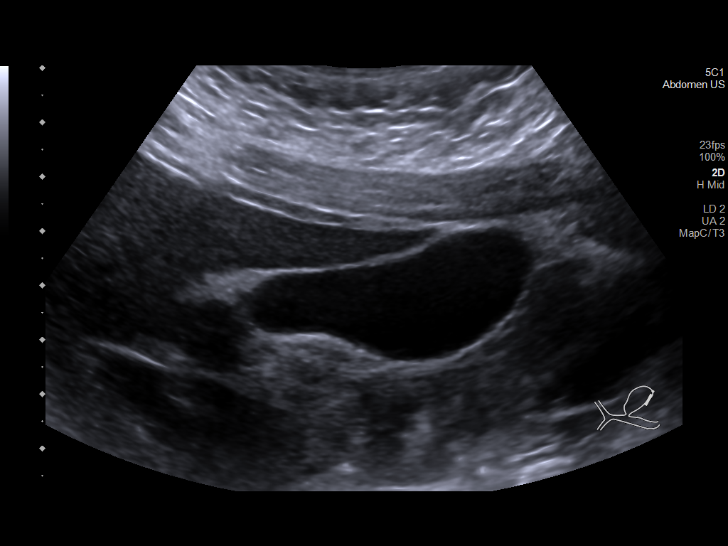
[im 15/50]
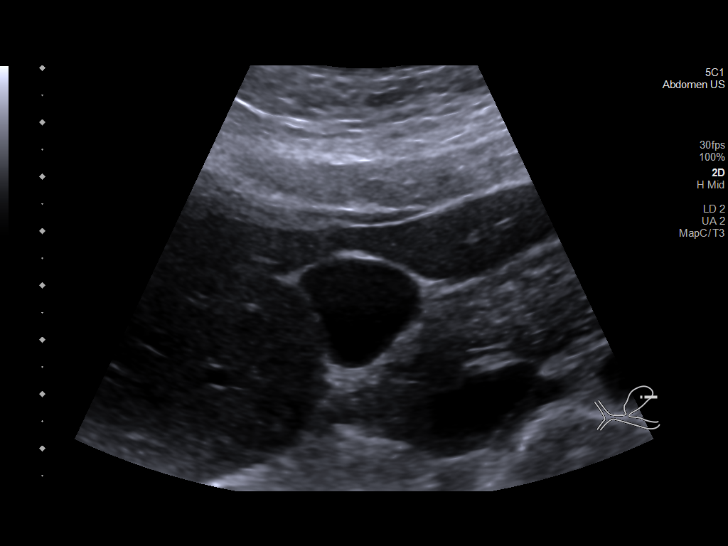
[im 19/50]
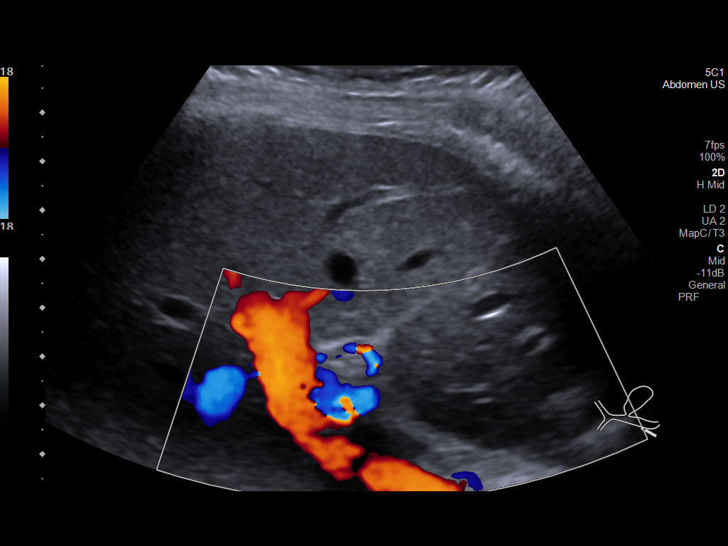
[im 23/50]
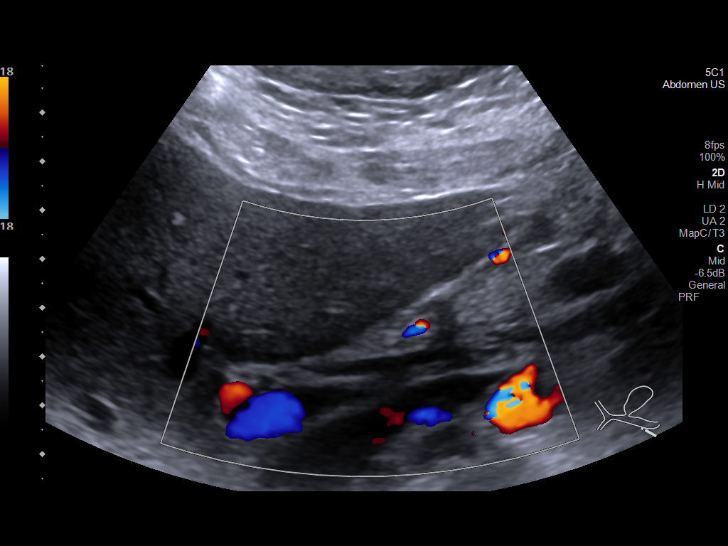
[im 27/50]
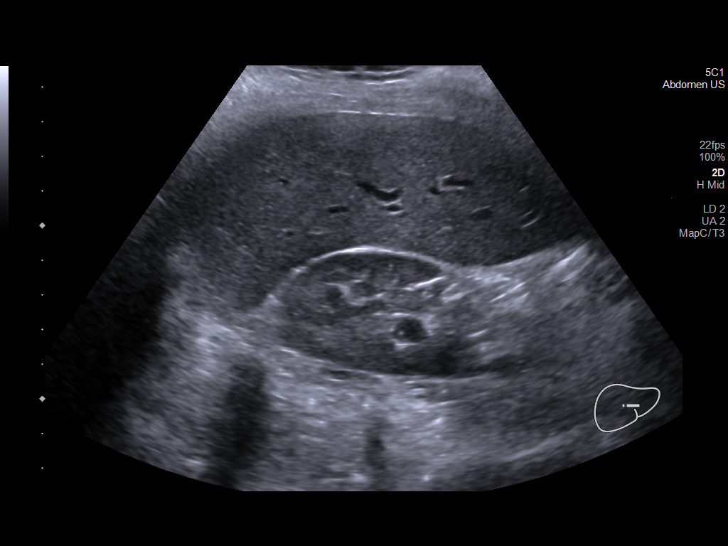
[im 31/50]
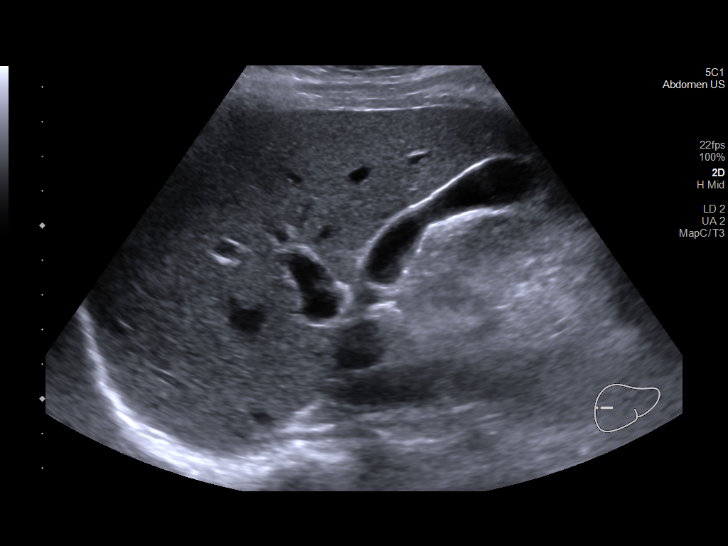
[im 35/50]
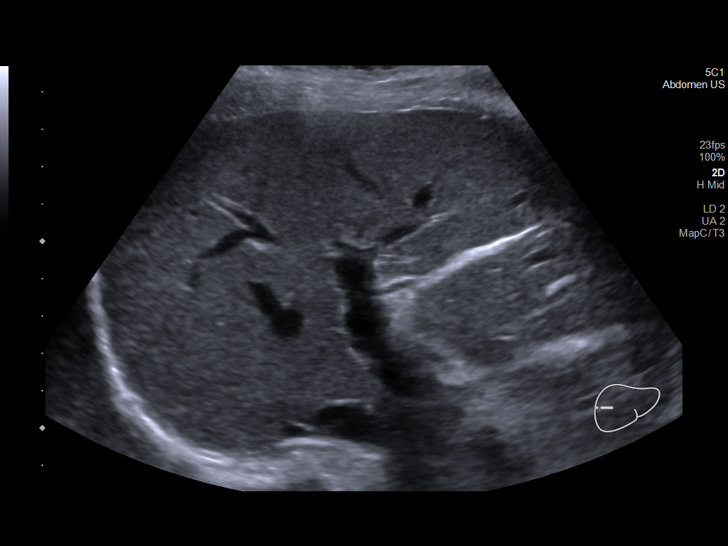
[im 39/50]
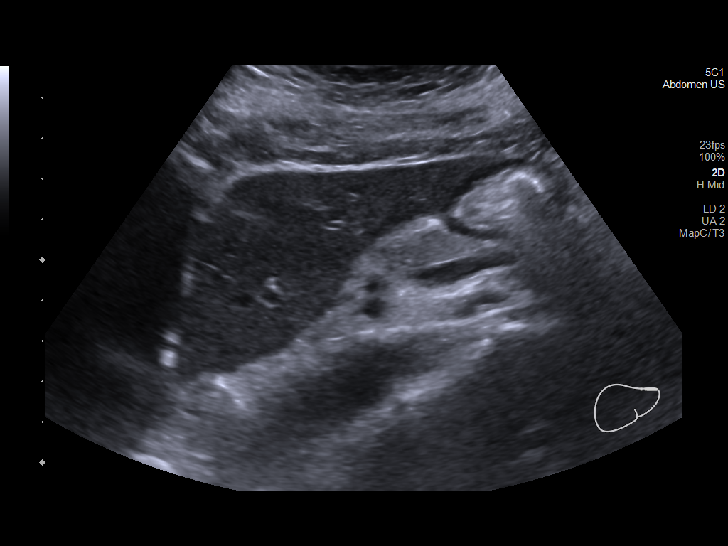
[im 43/50]
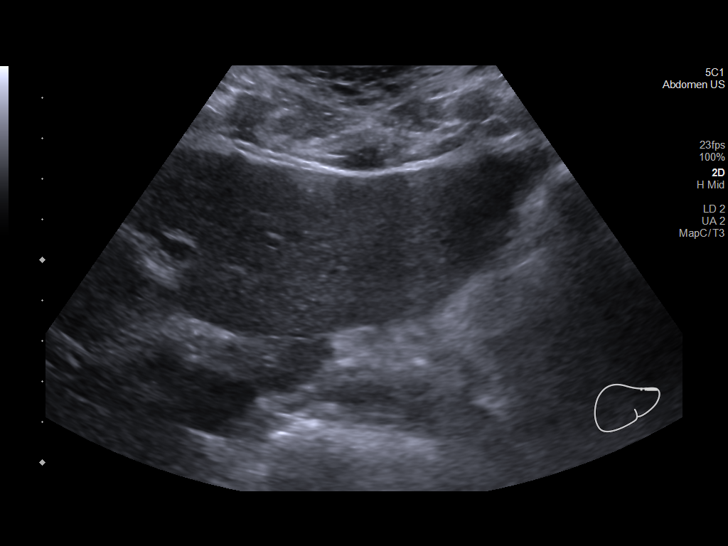
[im 47/50]
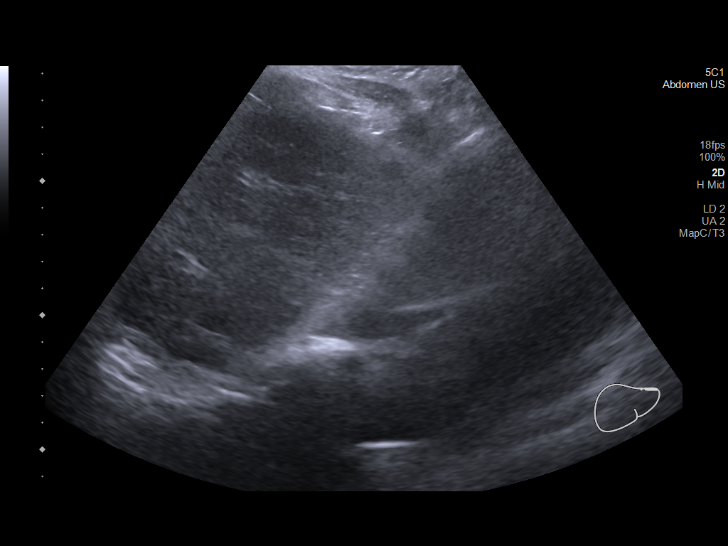

[12 of 25 positions shown; findings below may reference images not displayed]

FINDINGS: ULTRASOUND ABDOMEN LIMITED RIGHT UPPER QUADRANT

Gallbladder:

No gallstones or wall thickening visualized. No sonographic Murphy
sign noted.

Common bile duct:

Diameter:

Liver:

No focal lesion identified. Within normal limits in parenchymal
echogenicity. Portal vein is patent on color Doppler imaging with
normal direction of blood flow towards the liver.

ULTRASOUND HEPATIC ELASTOGRAPHY

Device: Siemens Helix VTQ

Patient position: Supine

Transducer 5C1

Number of measurements: 10

Hepatic segment:  8

Median kPa:

IQR:

IQR/Median kPa ratio:

Data quality:  None

Diagnostic category:  < or = 5 kPa: high probability of being normal
IMPRESSION: ULTRASOUND RUQ:

1. Normal exam

ULTRASOUND HEPATIC ELASTOGRAPHY:

Median kPa:

Diagnostic category:  < or = 5 kPa: high probability of being normal

The use of hepatic elastography is applicable to patients with viral
hepatitis and non-alcoholic fatty liver disease. At this time, there
is insufficient data for the referenced cut-off values and use in
other causes of liver disease, including alcoholic liver disease.
Patients, however, may be assessed by elastography and serve as
their own reference standard/baseline.

In patients with non-alcoholic liver disease, the values suggesting
compensated advanced chronic liver disease (cACLD) may be lower, and
patients may need additional testing with elasticity results of [DATE]
kPa.

Please note that abnormal hepatic elasticity and shear wave
velocities may also be identified in clinical settings other than
with hepatic fibrosis, such as: acute hepatitis, elevated right
heart and central venous pressures including use of beta blockers,
Comeni disease (Padam), infiltrative processes such as
mastocytosis/amyloidosis/infiltrative tumor/lymphoma, extrahepatic
cholestasis, with hyperemia in the post-prandial state, and with
liver transplantation. Correlation with patient history, laboratory
data, and clinical condition recommended.

Diagnostic Categories:

< or =5 kPa: high probability of being normal

< or =9 kPa: in the absence of other known clinical signs, rules [DATE] kPa and ?13 kPa: suggestive of cACLD, but needs further testing

>13 kPa: highly suggestive of cACLD

> or =17 kPa: highly suggestive of cACLD with an increased
probability of clinically significant portal hypertension

## 2021-09-22 IMAGING — US US ABDOMEN LIMITED W/ ELASTOGRAPHY
1 series · 12 of 19 positions shown · non-contrast
Comparison: CT AP 01/04/2020

CLINICAL DATA: Hepatitis C.

EXAM:
US ABDOMEN LIMITED - RIGHT UPPER QUADRANT
ULTRASOUND HEPATIC ELASTOGRAPHY
TECHNIQUE: Sonography of the right upper quadrant was performed. In addition,
ultrasound elastography evaluation of the liver was performed. A
region of interest was placed within the right lobe of the liver.
Following application of a compressive sonographic pulse, tissue
compressibility was assessed. Multiple assessments were performed at
the selected site. Median tissue compressibility was determined.
Previously, hepatic stiffness was assessed by shear wave velocity.
Based on recently published Society of Radiologists in Ultrasound
consensus article, reporting is now recommended to be performed in
the SI units of pressure (kiloPascals) representing hepatic
stiffness/elasticity. The obtained result is compared to the
published reference standards. (cACLD = compensated Advanced Chronic
Liver Disease)

[Series 1: us abdomen ruq w/elastography · 12 of 19 slices shown]
[im 1/19]
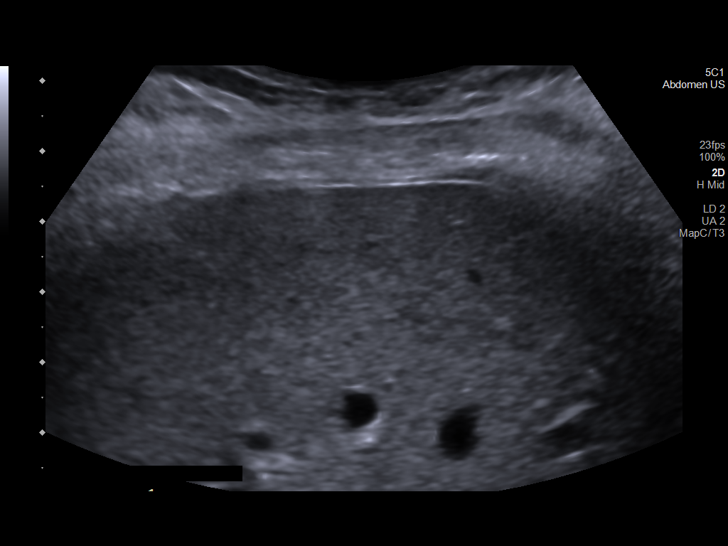
[im 3/19]
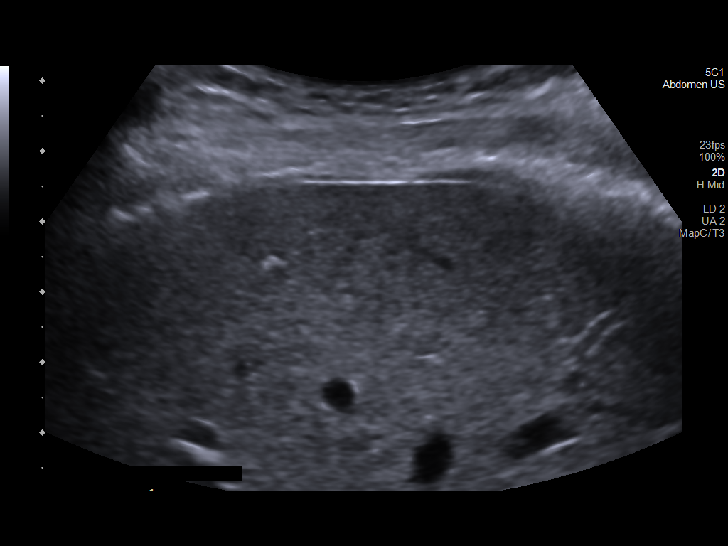
[im 4/19]
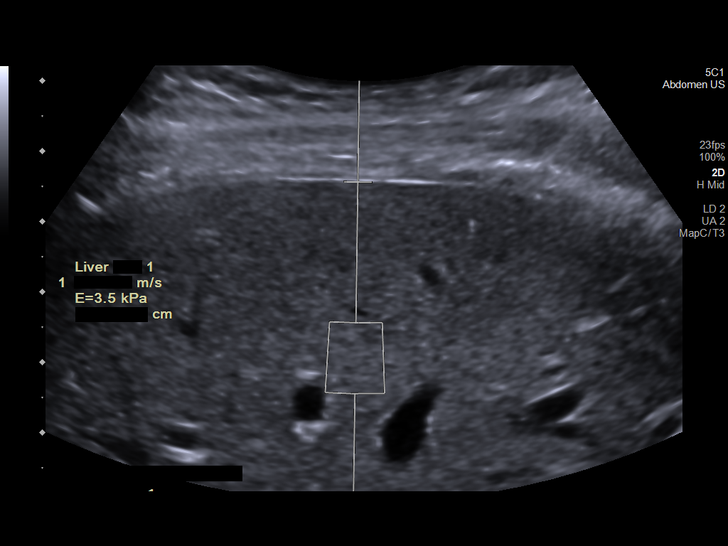
[im 6/19]
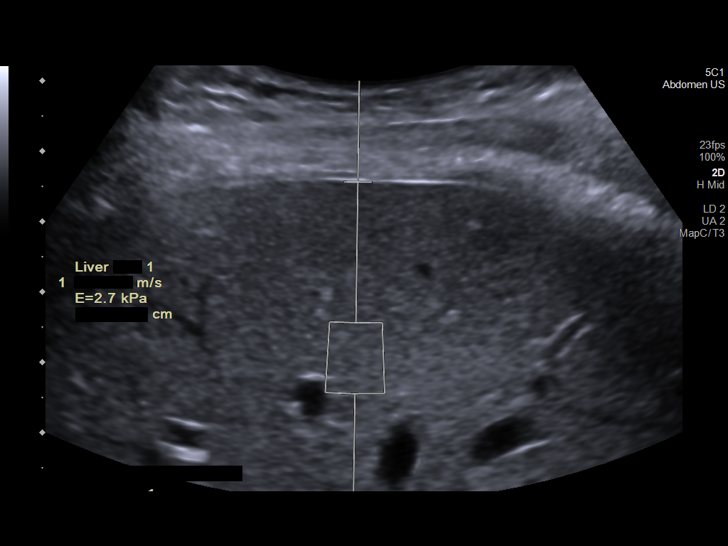
[im 8/19]
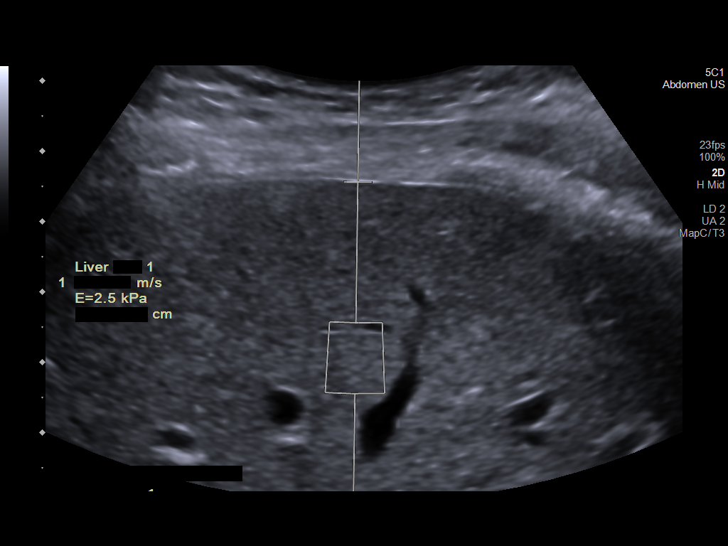
[im 9/19]
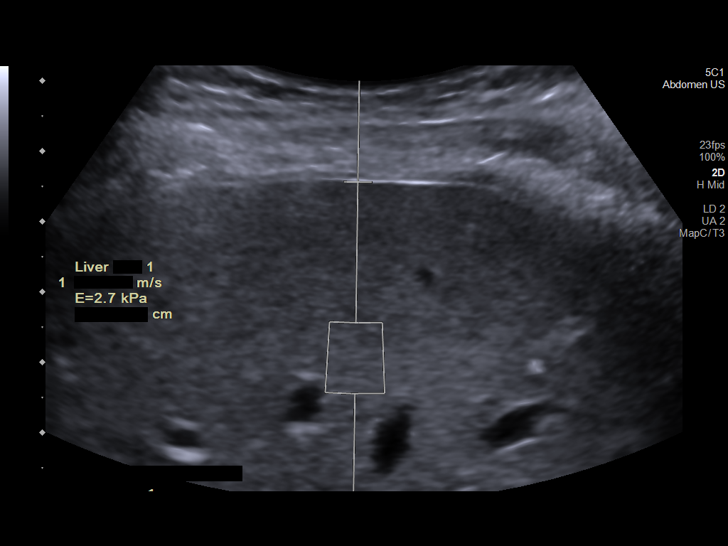
[im 11/19]
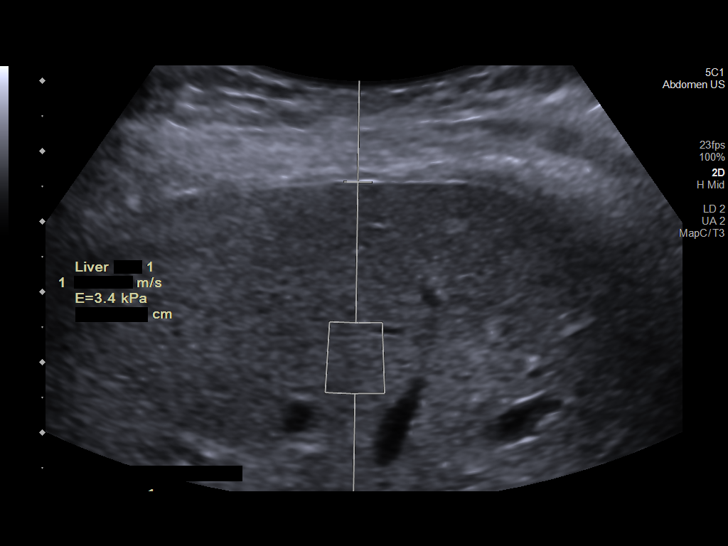
[im 12/19]
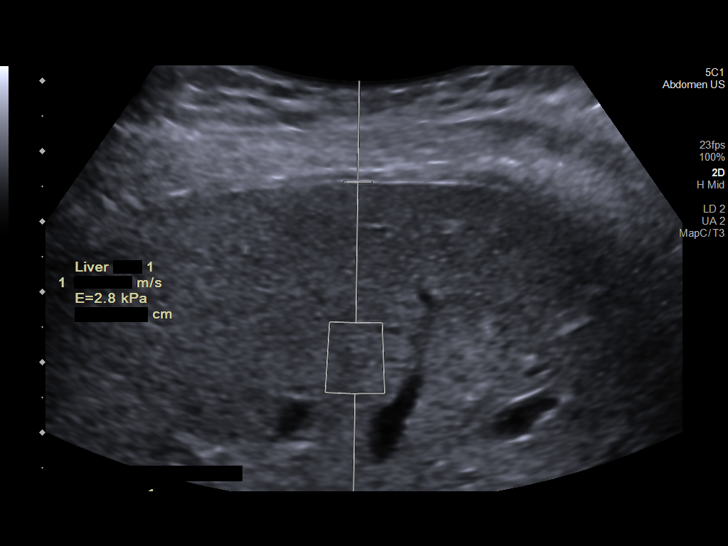
[im 14/19]
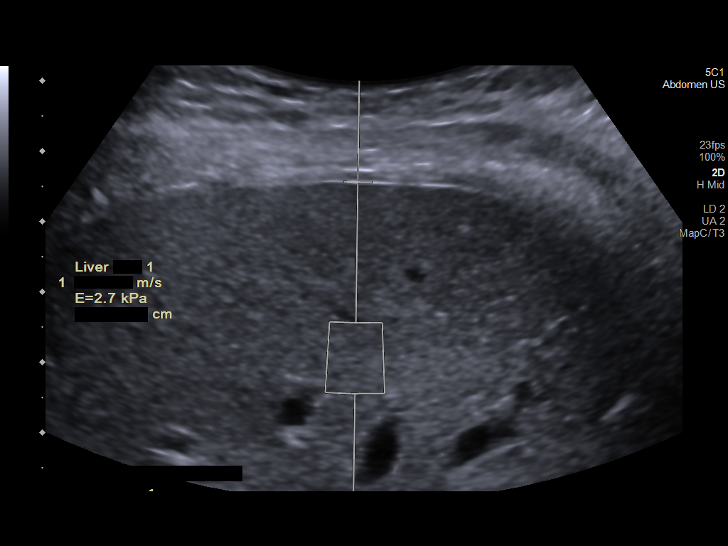
[im 16/19]
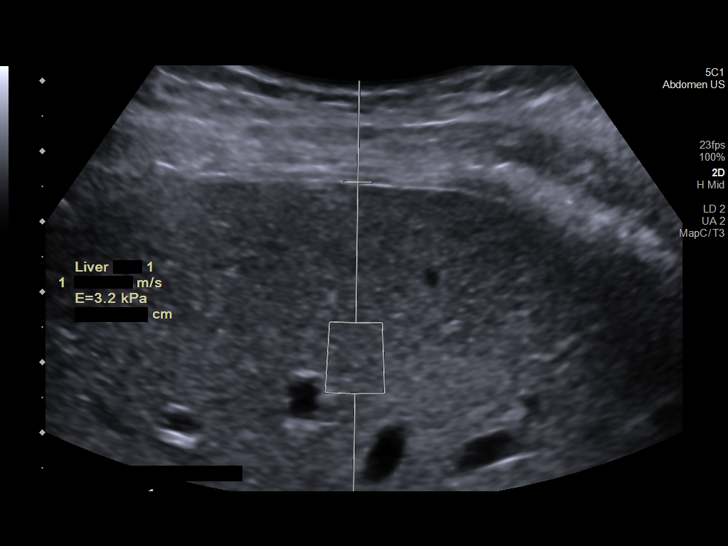
[im 17/19]
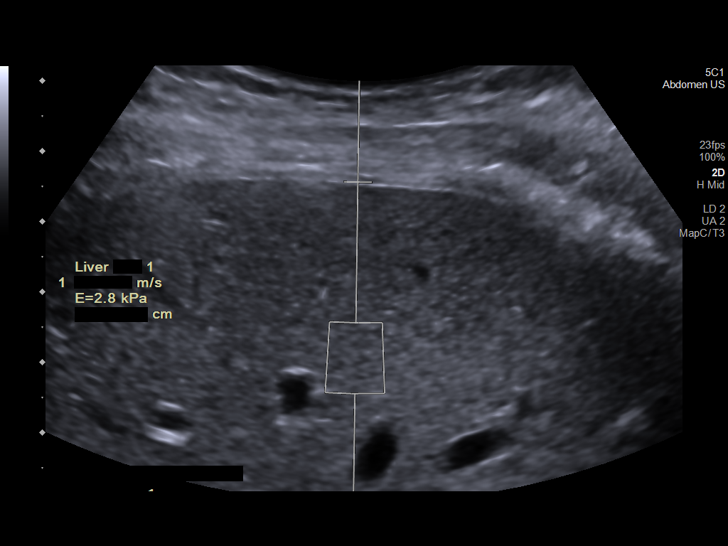
[im 19/19]
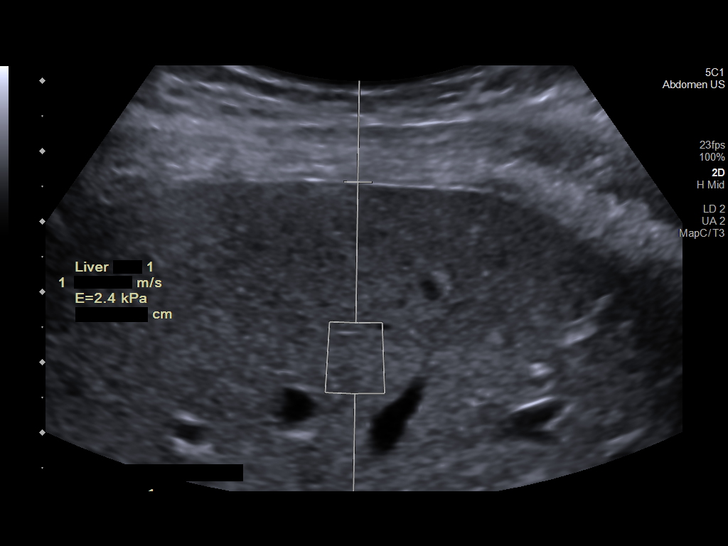

[12 of 19 positions shown; findings below may reference images not displayed]

FINDINGS: ULTRASOUND ABDOMEN LIMITED RIGHT UPPER QUADRANT

Gallbladder:

No gallstones or wall thickening visualized. No sonographic Murphy
sign noted.

Common bile duct:

Diameter:

Liver:

No focal lesion identified. Within normal limits in parenchymal
echogenicity. Portal vein is patent on color Doppler imaging with
normal direction of blood flow towards the liver.

ULTRASOUND HEPATIC ELASTOGRAPHY

Device: Siemens Helix VTQ

Patient position: Supine

Transducer [id number]

Number of measurements: 10

Hepatic segment:  8

Median kPa:

IQR:

IQR/Median kPa ratio:

Data quality:  None

Diagnostic category:  < or = 5 kPa: high probability of being normal
IMPRESSION: ULTRASOUND RUQ:

1. Normal exam

ULTRASOUND HEPATIC ELASTOGRAPHY:

Median kPa:

Diagnostic category:  < or = 5 kPa: high probability of being normal

The use of hepatic elastography is applicable to patients with viral
hepatitis and non-alcoholic fatty liver disease. At this time, there
is insufficient data for the referenced cut-off values and use in
other causes of liver disease, including alcoholic liver disease.
Patients, however, may be assessed by elastography and serve as
their own reference standard/baseline.

In patients with non-alcoholic liver disease, the values suggesting
compensated advanced chronic liver disease (cACLD) may be lower, and
patients may need additional testing with elasticity results of [DATE]
kPa.

Please note that abnormal hepatic elasticity and shear wave
velocities may also be identified in clinical settings other than
with hepatic fibrosis, such as: acute hepatitis, elevated right
heart and central venous pressures including use of beta blockers,
Comeni disease (Padam), infiltrative processes such as
mastocytosis/amyloidosis/infiltrative tumor/lymphoma, extrahepatic
cholestasis, with hyperemia in the post-prandial state, and with
liver transplantation. Correlation with patient history, laboratory
data, and clinical condition recommended.

Diagnostic Categories:

< or =5 kPa: high probability of being normal

< or =9 kPa: in the absence of other known clinical signs, rules [DATE] kPa and ?13 kPa: suggestive of cACLD, but needs further testing

>13 kPa: highly suggestive of cACLD

> or =17 kPa: highly suggestive of cACLD with an increased
probability of clinically significant portal hypertension

## 2022-11-13 ENCOUNTER — Encounter: Payer: Self-pay | Admitting: Neurology

## 2022-11-13 ENCOUNTER — Ambulatory Visit (INDEPENDENT_AMBULATORY_CARE_PROVIDER_SITE_OTHER): Payer: Medicaid Other | Admitting: Neurology

## 2022-11-13 VITALS — BP 138/92 | HR 98 | Ht 61.0 in | Wt 130.0 lb

## 2022-11-13 DIAGNOSIS — R569 Unspecified convulsions: Secondary | ICD-10-CM | POA: Diagnosis not present

## 2022-11-13 DIAGNOSIS — F445 Conversion disorder with seizures or convulsions: Secondary | ICD-10-CM

## 2022-11-13 NOTE — Patient Instructions (Signed)
Continue current medications Continue to follow with PCP and therapist Follow-up as needed.

## 2022-11-13 NOTE — Progress Notes (Signed)
[facility]  PATIENT: Emily Barajas DOB: Oct 30, 1995  REQUESTING CLINICIAN: Register, Wilburn Cornelia, MD HISTORY FROM: Patient  REASON FOR VISIT: Seizure like event   HISTORICAL  CHIEF COMPLAINT:  Chief Complaint  Patient presents with   New Patient (Initial Visit)    Rm 13. Patient with daughter, seizure like activity one month ago, uncontrolled body movement and eye movement, loses focus and doesn't know what is going on. Reports episode all together lasted 30 minutes where she came in an out of alertness. Previous to this episode, reports 2019 being last time for seizure like activity.     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  In terms of the PNES,  Patient reports having symptoms of shaking uncontrollably, she did see neurologist in the past had MRI, EEG and they were negative.  At one point, she was on Keppra. Review of the chart shows that the events were witnessed by neurologist and were deemed to be PNES.  Keppra discontinued. She did well over the past 3 years until a month ago when she had another event.  She reports during that time it was very stressful for her because she was closing on a house and had a 30-minutes event that she described as uncontrollable movements, could not control her body, could not think straight, eyes rolled back.  There was no injury, no tongue biting no urinary incontinence and they did not go to the hospital.  Since then she has not had any additional event.  She is doing therapy and she is on Zoloft.    Handedness: Right handed   Onset: At the age of 5  Seizure Type: PNES, uncontrollable shaking, eyes rolled back  Current frequency: A month ago, prior to that was 3 years ago  Any injuries from seizures: None  Seizure risk factors: None reported   Previous ASMs: Levetiracetam     Currenty ASMs: None   ASMs side effects: N/A  Brain Images: reported normal MRI   Previous EEGs: normal routine 2017   OTHER MEDICAL CONDITIONS: Anxiety, Depression, PTSD, Insomnia   REVIEW OF SYSTEMS: Full 14 system review of systems performed and negative with exception of: As noted in the HPI   ALLERGIES: Allergies  Allergen Reactions   Amoxicillin Anaphylaxis   Clindamycin Hives   Penicillin G Anaphylaxis    HOME MEDICATIONS: Outpatient Medications Prior to Visit  Medication Sig Dispense Refill   acyclovir (ZOVIRAX) 400 MG tablet Take 1 tablet (400 mg total) by mouth 3 (three) times daily. 90 tablet 3   hydrOXYzine (VISTARIL) 50 MG capsule Take 50 mg by mouth 3 (three) times daily.     venlafaxine XR (EFFEXOR-XR) 37.5 MG 24 hr capsule Take 12.5 mg by mouth every morning.     Blood Pressure Monitor MISC For regular home bp monitoring during pregnancy (Patient not taking: Reported on 11/13/2022) 1 each 0   docusate sodium (COLACE) 100 MG capsule Take 1 capsule (100 mg total) by mouth 2 (two) times daily. (Patient not taking: Reported on 11/13/2022) 10 capsule 0   doxylamine, Sleep, (UNISOM) 25 MG tablet Take 25 mg by mouth at bedtime as needed. (Patient not taking: Reported on 11/13/2022)     ondansetron (ZOFRAN-ODT) 4 MG disintegrating tablet TAKE 1 TABLET BY MOUTH EVERY 6 HOURS AS NEEDED FOR nausea (Patient not taking: Reported on 11/13/2022) 20  tablet 1   potassium chloride (KLOR-CON) 10 MEQ tablet Take 10 mEq by mouth 2 (two) times daily. (Patient not taking: Reported on 11/13/2022)     Prenatal Vit-Fe Fumarate-FA (MULTIVITAMIN-PRENATAL) 27-0.8 MG TABS tablet Take 1 tablet by mouth daily at 12 noon. (Patient not taking: Reported on 11/13/2022)     promethazine (PHENERGAN) 25 MG tablet Take 1 tablet (25 mg total) by mouth every 6 (six) hours as needed for nausea or vomiting. (Patient not taking: Reported on 11/13/2022) 30 tablet 1   No facility-administered medications prior to visit.     PAST MEDICAL HISTORY: Past Medical History:  Diagnosis Date   Anxiety    Asthma    Headache    Hepatitis    c-pt reports she has received treatment   HSV infection    Seizures    Pt does not remember when last one was, states "it's been awhile"    PAST SURGICAL HISTORY: History reviewed. No pertinent surgical history.  FAMILY HISTORY: Family History  Problem Relation Age of Onset   Stroke Mother    Bipolar disorder Father    Schizophrenia Father    Alcohol abuse Father    Polycystic ovary syndrome Sister    Depression Sister    Anxiety disorder Sister    Heart attack Maternal Grandfather    Cancer Paternal Grandfather    Other Paternal Grandfather        on dialysis    SOCIAL HISTORY: Social History   Socioeconomic History   Marital status: Single    Spouse name: Not on file   Number of children: Not on file   Years of education: Not on file   Highest education level: Not on file  Occupational History   Not on file  Tobacco Use   Smoking status: Every Day    Packs/day: .25    Types: Cigarettes   Smokeless tobacco: Former    Types: Nurse, children's Use: Former  Substance and Sexual Activity   Alcohol use: Not Currently    Comment: occas   Drug use: Not Currently    Frequency: 7.0 times per week    Types: Amphetamines, Marijuana, Methamphetamines    Comment: cocaine, meth, heroin, oxy, xanax, morphine 30 months since last use   Sexual activity: Yes    Birth control/protection: None  Other Topics Concern   Not on file  Social History Narrative   Not on file   Social Determinants of Health   Financial Resource Strain: Low Risk  (05/23/2021)   Overall Financial Resource Strain (CARDIA)    Difficulty of Paying Living Expenses: Not very hard  Food Insecurity: No Food Insecurity (05/23/2021)   Hunger Vital Sign    Worried About Running Out of Food in the Last Year: Never true    Ran Out of Food in the Last Year: Never true   Transportation Needs: No Transportation Needs (05/23/2021)   PRAPARE - Hydrologist (Medical): No    Lack of Transportation (Non-Medical): No  Physical Activity: Insufficiently Active (05/23/2021)   Exercise Vital Sign    Days of Exercise per Week: 2 days    Minutes of Exercise per Session: 20 min  Stress: No Stress Concern Present (05/23/2021)   [place]    Feeling of Stress : Only a little  Social Connections: Socially Isolated (05/23/2021)   Social Connection and Isolation Panel [NHANES]    Frequency of  Communication with Friends and Family: Once a week    Frequency of Social Gatherings with Friends and Family: Once a week    Attends Religious Services: More than 4 times per year    Active Member of [employer] or Organizations: No    Attends Archivist Meetings: Never    Marital Status: Never married  Intimate Partner Violence: Not At Risk (05/23/2021)   Humiliation, Afraid, Rape, and Kick questionnaire    Fear of Current or Ex-Partner: No    Emotionally Abused: No    Physically Abused: No    Sexually Abused: No     PHYSICAL EXAM  GENERAL EXAM/CONSTITUTIONAL: Vitals:  Vitals:   11/13/22 1320  BP: (!) 138/92  Pulse: 98  Weight: 130 lb (59 kg)  Height: 5\' 1"  (1.549 m)   Body mass index is 24.56 kg/m. Wt Readings from Last 3 Encounters:  11/13/22 130 lb (59 kg)  06/16/21 166 lb (75.3 kg)  05/30/21 158 lb (71.7 kg)   Patient is in no distress; well developed, nourished and groomed; neck is supple  EYES: Visual fields full to confrontation, Extraocular movements intacts,  No results found.  MUSCULOSKELETAL: Gait, strength, tone, movements noted in Neurologic exam below  NEUROLOGIC: MENTAL STATUS:      No data to display         awake, alert, oriented to person, place and time recent and remote memory intact normal attention and concentration language  fluent, comprehension intact, naming intact fund of knowledge appropriate  CRANIAL NERVE:  2nd, 3rd, 4th, 6th - Visual fields full to confrontation, extraocular muscles intact, no nystagmus 5th - facial sensation symmetric 7th - facial strength symmetric 8th - hearing intact 9th - palate elevates symmetrically, uvula midline 11th - shoulder shrug symmetric 12th - tongue protrusion midline  MOTOR:  normal bulk and tone, full strength in the BUE, BLE  SENSORY:  normal and symmetric to light touch  COORDINATION:  finger-nose-finger, fine finger movements normal  REFLEXES:  deep tendon reflexes present and symmetric  GAIT/STATION:  normal     DIAGNOSTIC DATA (LABS, IMAGING, TESTING) - I reviewed patient records, labs, notes, testing and imaging myself where available.  Lab Results  Component Value Date   WBC 10.0 05/23/2021   HGB 11.1 05/23/2021   HCT 32.5 (L) 05/23/2021   MCV 91 05/23/2021   PLT 256 05/23/2021      Component Value Date/Time   NA 135 12/27/2020 2112   K 3.0 (L) 12/27/2020 2112   CL 104 12/27/2020 2112   CO2 22 12/27/2020 2112   GLUCOSE 123 (H) 12/27/2020 2112   BUN 7 12/27/2020 2112   CREATININE 0.48 12/27/2020 2112   CALCIUM 8.3 (L) 12/27/2020 2112   PROT 6.3 (L) 12/27/2020 2112   ALBUMIN 3.8 12/27/2020 2112   AST 14 (L) 12/27/2020 2112   ALT 12 12/27/2020 2112   ALKPHOS 44 12/27/2020 2112   BILITOT 1.1 12/27/2020 2112   GFRNONAA >60 12/27/2020 2112   GFRAA >60 08/04/2017 1845   Lab Results  Component Value Date   CHOL 166 08/09/2017   HDL 56 08/09/2017   LDLCALC 93 08/09/2017   TRIG 85 08/09/2017   Lab Results  Component Value Date   HGBA1C 5.2 08/09/2017   No results found for: "VITAMINB12" Lab Results  Component Value Date   TSH 3.710 08/09/2017    EEG 2017 -No clear and precise evidence was seen for any focal or diffuse abnormality.   -Drowsiness and sleep were  achieved.  -No clear epileptiform activity was noted and  therefore no  definite evidence was noted for a seizure disorder.      I personally reviewed previous EEG reports.   ASSESSMENT AND PLAN  27 y.o. year old female  with history of anxiety, depression, PTSD, previous history of substance abuse, nonepileptic seizures who is presenting after an event last month, 30-minutes duration of uncontrollable shaking in the setting of high stress.  She reports during that time she was closing on a house and was very stressed out.  She does have a history of nonepileptic seizures.  Again this event was 30 minutes in duration, no injuries, no tongue biting or urinary incontinence, this is likely another nonepileptic event.  When discussing the possibility of PNES, patient was comfortable with the diagnosis.  She does have a therapist and I did advise her to talk to her therapist about it.  I did also advise her in the case that she has another event to contact me, at that time we will proceed with routine EEG.  Her previous workup including EEG and MRI brain was normal.  Continue to follow with PCP return as needed.    1. Nonepileptic episode   2. Psychogenic nonepileptic seizure     Patient Instructions  Continue current medications Continue to follow with PCP and therapist Follow-up as needed.   Per [facility] statutes, patients with seizures are not allowed to drive until they have been seizure-free for six months.  Other recommendations include using caution when using heavy equipment or power tools. Avoid working on ladders or at heights. Take showers instead of baths.  Do not swim alone.  Ensure the water temperature is not too high on the home water heater. Do not go swimming alone. Do not lock yourself in a room alone (i.e. bathroom). When caring for infants or small children, sit down when holding, feeding, or changing them to minimize risk of injury to the child in the event you have a seizure. Maintain good sleep hygiene. Avoid alcohol.  Also  recommend adequate sleep, hydration, good diet and minimize stress.   During the Seizure  - First, ensure adequate ventilation and place patients on the floor on their left side  Loosen clothing around the neck and ensure the airway is patent. If the patient is clenching the teeth, do not force the mouth open with any object as this can cause severe damage - Remove all items from the surrounding that can be hazardous. The patient may be oblivious to what's happening and may not even know what he or she is doing. If the patient is confused and wandering, either gently guide him/her away and block access to outside areas - Reassure the individual and be comforting - Call 911. In most cases, the seizure ends before EMS arrives. However, there are cases when seizures may last over 3 to 5 minutes. Or the individual may have developed breathing difficulties or severe injuries. If a pregnant patient or a person with diabetes develops a seizure, it is prudent to call an ambulance. - Finally, if the patient does not regain full consciousness, then call EMS. Most patients will remain confused for about 45 to 90 minutes after a seizure, so you must use judgment in calling for help. - Avoid restraints but make sure the patient is in a bed with padded side rails - Place the individual in a lateral position with the neck slightly flexed; this will help the saliva drain  from the mouth and prevent the tongue from falling backward - Remove all nearby furniture and other hazards from the area - Provide verbal assurance as the individual is regaining consciousness - Provide the patient with privacy if possible - Call for help and start treatment as ordered by the caregiver   After the Seizure (Postictal Stage)  After a seizure, most patients experience confusion, fatigue, muscle pain and/or a headache. Thus, one should permit the individual to sleep. For the next few days, reassurance is essential. Being calm and  helping reorient the person is also of importance.  Most seizures are painless and end spontaneously. Seizures are not harmful to others but can lead to complications such as stress on the lungs, brain and the heart. Individuals with prior lung problems may develop labored breathing and respiratory distress.     No orders of the defined types were placed in this encounter.   No orders of the defined types were placed in this encounter.   Return if symptoms worsen or fail to improve.    Alric Ran, MD 11/13/2022, 1:51 PM  [facility] [address], [place], [place] [address] [phone]
